# Patient Record
Sex: Male | Born: 1955 | Hispanic: Refuse to answer | Marital: Married | Smoking: Never smoker
Health system: Southern US, Community
[De-identification: ages and names within clinical notes are randomized; demographics above are authoritative.]

## PROBLEM LIST (undated history)

## (undated) DIAGNOSIS — D369 Benign neoplasm, unspecified site: Secondary | ICD-10-CM

## (undated) DIAGNOSIS — K7030 Alcoholic cirrhosis of liver without ascites: Secondary | ICD-10-CM

## (undated) DIAGNOSIS — K649 Unspecified hemorrhoids: Secondary | ICD-10-CM

## (undated) DIAGNOSIS — K2210 Ulcer of esophagus without bleeding: Secondary | ICD-10-CM

## (undated) DIAGNOSIS — K8020 Calculus of gallbladder without cholecystitis without obstruction: Secondary | ICD-10-CM

## (undated) DIAGNOSIS — I81 Portal vein thrombosis: Secondary | ICD-10-CM

## (undated) DIAGNOSIS — D126 Benign neoplasm of colon, unspecified: Secondary | ICD-10-CM

## (undated) DIAGNOSIS — Z9889 Other specified postprocedural states: Secondary | ICD-10-CM

## (undated) DIAGNOSIS — F1010 Alcohol abuse, uncomplicated: Secondary | ICD-10-CM

## (undated) HISTORY — DX: Calculus of gallbladder without cholecystitis without obstruction: K80.20

## (undated) HISTORY — PX: INCISIONAL HERNIA REPAIR: SHX193

## (undated) HISTORY — DX: Ulcer of esophagus without bleeding: K22.10

## (undated) HISTORY — DX: Unspecified hemorrhoids: K64.9

## (undated) HISTORY — DX: Benign neoplasm of colon, unspecified: D12.6

## (undated) HISTORY — DX: Alcoholic cirrhosis of liver without ascites: K70.30

## (undated) HISTORY — PX: OTHER SURGICAL HISTORY: SHX169

## (undated) HISTORY — DX: Portal vein thrombosis: I81

## (undated) HISTORY — DX: Benign neoplasm, unspecified site: D36.9

## (undated) HISTORY — DX: Alcohol abuse, uncomplicated: F10.10

## (undated) HISTORY — DX: Other specified postprocedural states: Z98.890

---

## 2010-08-17 ENCOUNTER — Ambulatory Visit: Payer: Self-pay | Admitting: Urgent Care

## 2010-08-23 DIAGNOSIS — Z9889 Other specified postprocedural states: Secondary | ICD-10-CM

## 2010-08-23 DIAGNOSIS — D126 Benign neoplasm of colon, unspecified: Secondary | ICD-10-CM

## 2010-08-23 HISTORY — DX: Benign neoplasm of colon, unspecified: D12.6

## 2010-08-23 HISTORY — DX: Other specified postprocedural states: Z98.890

## 2010-09-04 ENCOUNTER — Encounter: Payer: Self-pay | Admitting: Gastroenterology

## 2010-09-04 ENCOUNTER — Encounter: Payer: Self-pay | Admitting: Internal Medicine

## 2010-09-04 ENCOUNTER — Ambulatory Visit (INDEPENDENT_AMBULATORY_CARE_PROVIDER_SITE_OTHER): Payer: Self-pay | Admitting: Gastroenterology

## 2010-09-04 DIAGNOSIS — F1010 Alcohol abuse, uncomplicated: Secondary | ICD-10-CM | POA: Insufficient documentation

## 2010-09-04 DIAGNOSIS — K625 Hemorrhage of anus and rectum: Secondary | ICD-10-CM | POA: Insufficient documentation

## 2010-09-04 DIAGNOSIS — R634 Abnormal weight loss: Secondary | ICD-10-CM

## 2010-09-04 DIAGNOSIS — R1013 Epigastric pain: Secondary | ICD-10-CM | POA: Insufficient documentation

## 2010-09-11 NOTE — Letter (Signed)
Summary: TCS/EGD ORDER  TCS/EGD ORDER   Imported By: Ave Filter 09/04/2010 14:30:39  _____________________________________________________________________  External Attachment:    Type:   Image     Comment:   External Document
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
## 2010-09-14 ENCOUNTER — Other Ambulatory Visit: Payer: Self-pay | Admitting: Gastroenterology

## 2010-09-14 LAB — COMPREHENSIVE METABOLIC PANEL
CO2: 24 mEq/L (ref 19–32)
Calcium: 8.2 mg/dL — ABNORMAL LOW (ref 8.4–10.5)
Creat: 0.54 mg/dL (ref 0.40–1.50)
Glucose, Bld: 100 mg/dL — ABNORMAL HIGH (ref 70–99)
Total Bilirubin: 2 mg/dL — ABNORMAL HIGH (ref 0.3–1.2)
Total Protein: 7.7 g/dL (ref 6.0–8.3)

## 2010-09-14 LAB — LIPASE: Lipase: 54 U/L (ref 0–75)

## 2010-09-15 LAB — CBC WITH DIFFERENTIAL/PLATELET
Eosinophils Absolute: 0.4 10*3/uL (ref 0.0–0.7)
Eosinophils Relative: 7 % — ABNORMAL HIGH (ref 0–5)
HCT: 35.9 % — ABNORMAL LOW (ref 39.0–52.0)
Hemoglobin: 11.8 g/dL — ABNORMAL LOW (ref 13.0–17.0)
Lymphs Abs: 1.4 10*3/uL (ref 0.7–4.0)
MCH: 28 pg (ref 26.0–34.0)
MCV: 85.1 fL (ref 78.0–100.0)
Monocytes Relative: 19 % — ABNORMAL HIGH (ref 3–12)
RBC: 4.22 MIL/uL (ref 4.22–5.81)

## 2010-09-16 ENCOUNTER — Other Ambulatory Visit: Payer: Self-pay | Admitting: Gastroenterology

## 2010-09-16 DIAGNOSIS — R945 Abnormal results of liver function studies: Secondary | ICD-10-CM

## 2010-09-16 DIAGNOSIS — F1010 Alcohol abuse, uncomplicated: Secondary | ICD-10-CM

## 2010-09-16 DIAGNOSIS — D649 Anemia, unspecified: Secondary | ICD-10-CM

## 2010-09-17 ENCOUNTER — Encounter: Payer: Self-pay | Admitting: Gastroenterology

## 2010-09-17 ENCOUNTER — Other Ambulatory Visit (HOSPITAL_COMMUNITY): Payer: Self-pay

## 2010-09-17 LAB — IRON AND TIBC
%SAT: 15 % — ABNORMAL LOW (ref 20–55)
TIBC: 317 ug/dL (ref 215–435)

## 2010-09-17 LAB — FOLATE: Folate: 12.2 ng/mL

## 2010-09-17 LAB — FERRITIN: Ferritin: 42 ng/mL (ref 22–322)

## 2010-09-17 NOTE — Progress Notes (Signed)
I spoke with French Ana in Endo and told her to have the pt come by the office in the morning so we can get him scheduled for his ct scan.  We are unable to reach pt by phone.Marland KitchenMarland Kitchen

## 2010-09-18 ENCOUNTER — Encounter (HOSPITAL_COMMUNITY): Payer: Self-pay | Attending: Internal Medicine

## 2010-09-18 DIAGNOSIS — Z139 Encounter for screening, unspecified: Secondary | ICD-10-CM | POA: Insufficient documentation

## 2010-09-20 ENCOUNTER — Other Ambulatory Visit: Payer: Self-pay | Admitting: Internal Medicine

## 2010-09-20 ENCOUNTER — Encounter: Payer: Medicaid Other | Admitting: Internal Medicine

## 2010-09-20 ENCOUNTER — Ambulatory Visit (HOSPITAL_COMMUNITY)
Admission: RE | Admit: 2010-09-20 | Discharge: 2010-09-20 | Disposition: A | Discharge status: Home / Self Care | Payer: Medicaid Other | Source: Ambulatory Visit | Attending: Internal Medicine | Admitting: Internal Medicine

## 2010-09-20 ENCOUNTER — Inpatient Hospital Stay (HOSPITAL_COMMUNITY): Admission: RE | Admit: 2010-09-20 | Payer: Self-pay | Source: Ambulatory Visit

## 2010-09-20 DIAGNOSIS — D126 Benign neoplasm of colon, unspecified: Secondary | ICD-10-CM | POA: Insufficient documentation

## 2010-09-20 DIAGNOSIS — K2100 Gastro-esophageal reflux disease with esophagitis, without bleeding: Secondary | ICD-10-CM | POA: Insufficient documentation

## 2010-09-20 DIAGNOSIS — K921 Melena: Secondary | ICD-10-CM | POA: Insufficient documentation

## 2010-09-20 DIAGNOSIS — K2940 Chronic atrophic gastritis without bleeding: Secondary | ICD-10-CM

## 2010-09-20 DIAGNOSIS — R1013 Epigastric pain: Secondary | ICD-10-CM | POA: Insufficient documentation

## 2010-09-20 DIAGNOSIS — D369 Benign neoplasm, unspecified site: Secondary | ICD-10-CM

## 2010-09-20 DIAGNOSIS — K649 Unspecified hemorrhoids: Secondary | ICD-10-CM

## 2010-09-20 DIAGNOSIS — K648 Other hemorrhoids: Secondary | ICD-10-CM | POA: Insufficient documentation

## 2010-09-20 HISTORY — PX: COLONOSCOPY: SHX174

## 2010-09-20 HISTORY — DX: Benign neoplasm, unspecified site: D36.9

## 2010-09-20 HISTORY — PX: ESOPHAGOGASTRODUODENOSCOPY: SHX1529

## 2010-09-21 NOTE — Progress Notes (Signed)
Please make sure CT of the abdomen and pelvis gets scheduled as previously requested. Recommended LFTs in 2 weeks.

## 2010-09-21 NOTE — Progress Notes (Signed)
Pt is scheduled for ct on 09/25/10.

## 2010-09-25 ENCOUNTER — Inpatient Hospital Stay (HOSPITAL_COMMUNITY): Admission: RE | Admit: 2010-09-25 | Payer: Self-pay | Source: Ambulatory Visit

## 2010-09-25 ENCOUNTER — Other Ambulatory Visit (HOSPITAL_COMMUNITY): Payer: Self-pay

## 2010-09-28 ENCOUNTER — Other Ambulatory Visit (HOSPITAL_COMMUNITY): Payer: Self-pay

## 2010-10-02 NOTE — Op Note (Addendum)
NAME:  Austin Ruiz, Austin Ruiz                  ACCOUNT NO.:  [id number]  MEDICAL RECORD NO.:  [id number]           PATIENT TYPE:  A  LOCATION:  RAD                           FACILITY:  [facility]  PHYSICIAN:  R. Roetta Sessions, M.D. DATE OF BIRTH:  02-Jun-1956  DATE OF PROCEDURE:  09/20/2010 DATE OF DISCHARGE:                              OPERATIVE REPORT   INDICATIONS FOR PROCEDURE:  A 55 year old gentleman with a recent epigastric pain, hematochezia in the setting of a consumption of a good 4-5 40 ounces bottles beer daily.  EGD and colonoscopy now being done. Risks, benefits, limitations, alternatives, imponderables have been discussed, questions answered.  Please see the documentation in the medical record.  PROCEDURE NOTE:  The patient was done under Propofol.  O2 saturation, blood pressure, pulse, respirations were monitored throughout the entire procedure.  Propofol per Dr. Jayme Cloud' associates.  The patient was placed in left lateral decubitus position.  Cetacaine spray for topical pharyngeal anesthesia.  FINDINGS:  EGD examination of the tubular esophagus revealed distal esophageal erosions within 5 mm of the GE junction.  There is no Barrett esophagus.  There are no varices, no tumor.  Patent esophagus throughout its length.  EG junction easily traversed.  Stomach:  Gastric cavity was emptied and insufflated well with air.  Thorough examination of gastric mucosa including retroflexion of proximal stomach and esophagogastric junction demonstrated diffuse submucosal petechiae and a scattered erosions.  There were no ulcer infiltrating process, findings not consistent with portal gastropathy.  Pylorus patent, easily traversed, examination of the bulb and second portion revealed no abnormalities. Therapeutic/diagnostic maneuvers performed.  Biopsies of the antrum and body were taken to rule out H. Pylori.  The patient tolerated the procedure well and was prepared for colonoscopy.  Digital  rectal exam revealed no abnormalities.  Endoscopic Findings:  The prep was adequate. Colon:  Colonic mucosa surveyed from the rectosigmoid junction through the left transverse right colon to the appendiceal orifice, ileocecal valve/cecum.  These structures well seen photographed for the record. From this level, scope was slowly and cautiously withdrawn.  All previously mentioned mucosal surfaces were again seen.  The patient was noted have a 5-mm polyp in the ascending colon which was cold biopsy/removed.  Remainder of the colonic mucosa appeared normal.  The scope was pulled down in the rectum where thorough examination of the rectal mucosa including retroflexed view of the anal verge, en face view of the anal canal demonstrated friable anal canal with some anal canal hemorrhoids, otherwise rectal mucosa appeared normal.  The patient tolerated this procedure well.  Cecal withdrawal time 10 minutes.  IMPRESSION: 1. Esophagogastroduodenoscopy, distal esophageal erosions consistent     with erosive reflux esophagitis. 2. Diffuse submucosal gastric petechiae and the gastric erosions status post biopsy, patent pylorus, normal D1, D2.  Colonoscopy     findings, friable anal canal/anal canal hemorrhoids, likely source     of hematochezia, otherwise normal rectum. 3. Ascending colon polyp status post cold biopsy removal.  RECOMMENDATIONS: 1. Stop consuming alcohol. 2. Antireflux literature and hemorrhoid literature provided to Mr.     Raffel.  Begin  Protonix 40 mg orally daily. 3. A 10-day course of Anusol-HC Suppository 1 per rectum at bedtime. 4. Followup appointment with [place] in 6 weeks. 5. Followup on path.     [name], M.D.     RMR/MEDQ  D:  09/20/2010  T:  09/21/2010  Job:  [id number]  Electronically Signed by Lorrin Goodell M.D. on 10/02/2010 02:52:03 PM

## 2010-10-03 ENCOUNTER — Other Ambulatory Visit (HOSPITAL_COMMUNITY): Payer: Self-pay

## 2010-10-09 ENCOUNTER — Ambulatory Visit (HOSPITAL_COMMUNITY)
Admission: RE | Admit: 2010-10-09 | Discharge: 2010-10-09 | Disposition: A | Discharge status: Home / Self Care | Payer: Medicaid Other | Source: Ambulatory Visit | Attending: Gastroenterology | Admitting: Gastroenterology

## 2010-10-09 ENCOUNTER — Other Ambulatory Visit: Payer: Self-pay | Admitting: Gastroenterology

## 2010-10-09 DIAGNOSIS — R188 Other ascites: Secondary | ICD-10-CM | POA: Insufficient documentation

## 2010-10-09 DIAGNOSIS — R748 Abnormal levels of other serum enzymes: Secondary | ICD-10-CM | POA: Insufficient documentation

## 2010-10-09 DIAGNOSIS — K8020 Calculus of gallbladder without cholecystitis without obstruction: Secondary | ICD-10-CM | POA: Insufficient documentation

## 2010-10-09 DIAGNOSIS — F1021 Alcohol dependence, in remission: Secondary | ICD-10-CM | POA: Insufficient documentation

## 2010-10-09 MED ORDER — IOHEXOL 300 MG/ML  SOLN
100.0000 mL | Freq: Once | INTRAMUSCULAR | Status: AC | PRN
  Administered 2010-10-09: 100 mL via INTRAVENOUS

## 2010-10-22 ENCOUNTER — Other Ambulatory Visit: Payer: Self-pay | Admitting: Gastroenterology

## 2010-10-22 DIAGNOSIS — R945 Abnormal results of liver function studies: Secondary | ICD-10-CM

## 2010-10-22 DIAGNOSIS — K7460 Unspecified cirrhosis of liver: Secondary | ICD-10-CM

## 2010-10-22 DIAGNOSIS — D696 Thrombocytopenia, unspecified: Secondary | ICD-10-CM

## 2010-10-23 ENCOUNTER — Other Ambulatory Visit: Payer: Self-pay | Admitting: Gastroenterology

## 2010-10-24 LAB — HEPATIC FUNCTION PANEL
ALT: 23 U/L (ref 0–53)
AST: 106 U/L — ABNORMAL HIGH (ref 0–37)
Bilirubin, Direct: 0.8 mg/dL — ABNORMAL HIGH (ref 0.0–0.3)
Indirect Bilirubin: 1.4 mg/dL — ABNORMAL HIGH (ref 0.0–0.9)
Total Protein: 7.3 g/dL (ref 6.0–8.3)

## 2010-10-24 LAB — CBC WITH DIFFERENTIAL/PLATELET
Basophils Relative: 3 % — ABNORMAL HIGH (ref 0–1)
HCT: 34.9 % — ABNORMAL LOW (ref 39.0–52.0)
Hemoglobin: 11.2 g/dL — ABNORMAL LOW (ref 13.0–17.0)
Lymphocytes Relative: 24 % (ref 12–46)
MCHC: 32.1 g/dL (ref 30.0–36.0)
Monocytes Absolute: 0.9 10*3/uL (ref 0.1–1.0)
Monocytes Relative: 15 % — ABNORMAL HIGH (ref 3–12)
Neutro Abs: 3.2 10*3/uL (ref 1.7–7.7)

## 2010-10-24 LAB — BASIC METABOLIC PANEL
CO2: 25 mEq/L (ref 19–32)
Glucose, Bld: 100 mg/dL — ABNORMAL HIGH (ref 70–99)
Potassium: 4.2 mEq/L (ref 3.5–5.3)
Sodium: 137 mEq/L (ref 135–145)

## 2010-10-24 LAB — AFP TUMOR MARKER: AFP-Tumor Marker: 8.1 ng/mL — ABNORMAL HIGH (ref 0.0–8.0)

## 2010-10-29 ENCOUNTER — Other Ambulatory Visit: Payer: Self-pay | Admitting: Gastroenterology

## 2010-10-29 DIAGNOSIS — K7460 Unspecified cirrhosis of liver: Secondary | ICD-10-CM

## 2010-11-01 ENCOUNTER — Encounter: Payer: Self-pay | Admitting: Gastroenterology

## 2010-11-01 ENCOUNTER — Ambulatory Visit (INDEPENDENT_AMBULATORY_CARE_PROVIDER_SITE_OTHER): Payer: Medicaid Other | Admitting: Gastroenterology

## 2010-11-01 ENCOUNTER — Ambulatory Visit: Payer: Self-pay | Admitting: Gastroenterology

## 2010-11-01 VITALS — BP 139/79 | HR 64 | Temp 98.6°F | Ht 63.0 in | Wt 151.0 lb

## 2010-11-01 DIAGNOSIS — K7460 Unspecified cirrhosis of liver: Secondary | ICD-10-CM

## 2010-11-01 MED ORDER — CHLORDIAZEPOXIDE HCL 25 MG PO CAPS: 25.0000 mg | ORAL_CAPSULE | Freq: Three times a day (TID) | ORAL | Status: DC | PRN

## 2010-11-01 MED ORDER — DEXLANSOPRAZOLE 60 MG PO CPDR: 60.0000 mg | DELAYED_RELEASE_CAPSULE | Freq: Every day | ORAL | Status: DC
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
## 2010-11-01 NOTE — Patient Instructions (Addendum)
Complete stool sample and return to our office.  If you are serious about quitting alcohol, we have provided you a medication (Librium) to help with possible side effects of withdrawal such as anxiety or tremors. DO NOT TAKE THIS WITH ALCOHOL. If you decide to keep drinking, DO NOT TAKE THIS MEDICATION.  Continue Dexilant. Activate the savings card before filling the prescription.  We will see you back in 1 week to discuss how you are doing.

## 2010-11-02 LAB — PROTIME-INR: INR: 1.6 — ABNORMAL HIGH (ref <1.50)

## 2010-11-06 ENCOUNTER — Ambulatory Visit: Payer: Self-pay | Admitting: Gastroenterology

## 2010-11-06 ENCOUNTER — Encounter: Payer: Self-pay | Admitting: Gastroenterology

## 2010-11-06 DIAGNOSIS — K7460 Unspecified cirrhosis of liver: Secondary | ICD-10-CM | POA: Insufficient documentation

## 2010-11-06 DIAGNOSIS — D649 Anemia, unspecified: Secondary | ICD-10-CM

## 2010-11-06 NOTE — Progress Notes (Signed)
Pt returned ifobt and it was positive 

## 2010-11-06 NOTE — Progress Notes (Signed)
Cc to PCP 

## 2010-11-06 NOTE — Assessment & Plan Note (Signed)
Newly diagnosed, likely r/t ETOH abuse. recent AFP and CT performed for HCC. AFP mildly elevated at 8.1: to be rechecked in 2 months. No ascites on exam. Pt desires ETOH cessation. Discussed in detail the diagnosis of cirrhosis, management, plan, and future possibilities for patient. Counseled at length on ETOH cessation. Will help facilitate this with low-dose Librium, as pt has expressed a significant desire to stop. Will follow pt closely; return to clinic in 1 week. Counseled on avoidance of ETOH while taking Librium.   As of note, still need to collect ifobt due to anemia. EGD/colonoscopy up-to-date.  Finally, pt with RUQ tenderness. Assess at visit in 1 week. Known stones on CT. Consider HIDA if symptoms increase/worsen, associated with eating/drinking.  Continue dexilant PT/INR today

## 2010-11-08 ENCOUNTER — Encounter: Payer: Self-pay | Admitting: Gastroenterology

## 2010-11-08 ENCOUNTER — Ambulatory Visit (INDEPENDENT_AMBULATORY_CARE_PROVIDER_SITE_OTHER): Payer: Self-pay | Admitting: Gastroenterology

## 2010-11-08 DIAGNOSIS — R1013 Epigastric pain: Secondary | ICD-10-CM

## 2010-11-08 DIAGNOSIS — K7460 Unspecified cirrhosis of liver: Secondary | ICD-10-CM

## 2010-11-08 MED ORDER — DEXLANSOPRAZOLE 60 MG PO CPDR: 60.0000 mg | DELAYED_RELEASE_CAPSULE | Freq: Every day | ORAL | Status: AC

## 2010-11-08 NOTE — Assessment & Plan Note (Signed)
See under cirrhosis.
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
## 2010-11-08 NOTE — Assessment & Plan Note (Signed)
Newly diagnosed cirrhosis, likely ETOH related. Has not quit alcohol consumption, although he expressed a desire to do so last week. Definitely clouding the clinical picture in regards to abdominal pain, RUQ/epigastric pain. Biliary component remains in the differential. Discussed with pt at length possible work-up; however, also discussed the need for alcohol cessation before proceeding with unnecessary tests.   1. Repeat CBC in 4 weeks. Assess for stability. No need for capsule study currently. 2. Alcohol cessation. Once pt has achieved this, possibly pursue HIDA scan. 3. Continue PPI 4. F/U in 4 weeks. I discussed with pt in detail that we would be unable to fully assist him unless he quit drinking. He was very agreeable and pleasant, and he says he understands.

## 2010-11-08 NOTE — Progress Notes (Signed)
For Austin Ruiz.  

## 2010-11-09 NOTE — Progress Notes (Signed)
Cc to PCP 

## 2010-11-13 NOTE — Progress Notes (Signed)
Reviewed. Please see office note.

## 2010-11-23 NOTE — Progress Notes (Signed)
agree

## 2010-12-06 ENCOUNTER — Ambulatory Visit: Payer: Self-pay | Admitting: Gastroenterology

## 2010-12-11 ENCOUNTER — Encounter: Payer: Self-pay | Admitting: Urgent Care

## 2010-12-11 ENCOUNTER — Ambulatory Visit (INDEPENDENT_AMBULATORY_CARE_PROVIDER_SITE_OTHER): Payer: Medicaid Other | Admitting: Urgent Care

## 2010-12-11 VITALS — BP 132/65 | HR 74 | Temp 98.7°F | Ht 62.0 in | Wt 152.6 lb

## 2010-12-11 DIAGNOSIS — R1013 Epigastric pain: Secondary | ICD-10-CM

## 2010-12-11 DIAGNOSIS — M79661 Pain in right lower leg: Secondary | ICD-10-CM

## 2010-12-11 DIAGNOSIS — R609 Edema, unspecified: Secondary | ICD-10-CM

## 2010-12-11 DIAGNOSIS — R600 Localized edema: Secondary | ICD-10-CM

## 2010-12-11 DIAGNOSIS — M79609 Pain in unspecified limb: Secondary | ICD-10-CM

## 2010-12-11 DIAGNOSIS — K7460 Unspecified cirrhosis of liver: Secondary | ICD-10-CM

## 2010-12-11 DIAGNOSIS — F1010 Alcohol abuse, uncomplicated: Secondary | ICD-10-CM

## 2010-12-11 LAB — COMPREHENSIVE METABOLIC PANEL
ALT: 32 U/L (ref 0–53)
BUN: 6 mg/dL (ref 6–23)
CO2: 26 mEq/L (ref 19–32)
Calcium: 8.2 mg/dL — ABNORMAL LOW (ref 8.4–10.5)
Chloride: 104 mEq/L (ref 96–112)
Creat: 0.47 mg/dL — ABNORMAL LOW (ref 0.50–1.35)
Glucose, Bld: 98 mg/dL (ref 70–99)
Total Bilirubin: 1.8 mg/dL — ABNORMAL HIGH (ref 0.3–1.2)

## 2010-12-11 LAB — CBC WITH DIFFERENTIAL/PLATELET
Basophils Absolute: 0.2 10*3/uL — ABNORMAL HIGH (ref 0.0–0.1)
Eosinophils Relative: 7 % — ABNORMAL HIGH (ref 0–5)
HCT: 35 % — ABNORMAL LOW (ref 39.0–52.0)
Hemoglobin: 11.4 g/dL — ABNORMAL LOW (ref 13.0–17.0)
Lymphocytes Relative: 28 % (ref 12–46)
Lymphs Abs: 2.3 10*3/uL (ref 0.7–4.0)
MCV: 91.4 fL (ref 78.0–100.0)
Monocytes Absolute: 1.2 10*3/uL — ABNORMAL HIGH (ref 0.1–1.0)
Neutro Abs: 3.8 10*3/uL (ref 1.7–7.7)
RBC: 3.83 MIL/uL — ABNORMAL LOW (ref 4.22–5.81)
RDW: 19.7 % — ABNORMAL HIGH (ref 11.5–15.5)
WBC: 8 10*3/uL (ref 4.0–10.5)

## 2010-12-11 NOTE — Progress Notes (Signed)
Cc to PCP 

## 2010-12-11 NOTE — Assessment & Plan Note (Addendum)
Mr. Rosier seems to be in denial as far as his etoh abuse and cirrhosis is concerned.  New c/o calf pain bilaterally & lower extremity edema.  No ascites.  Mildly elevated AFP will need FU next month.  No mass on recent CT.  Recent EGD without varices, but erosive esophagitis/gastritis.  Life threatening complications of cirrhosis and pt non-compliance discussed w/ pt & wife.    Bilat doppler ultrasound LE to r/o DVT STAT CBC & CMP I will call you with results Call Dr. Neita Carp as soon as possible to discuss your options to quit drinking-pt prefers inpatient treatment QUIT DRINKING ALCOHOL!   DO NOT BUY ALCOHOL OR KEEP IT AT YOUR HOUSE!

## 2010-12-11 NOTE — Assessment & Plan Note (Signed)
New.  R/o DVT vs. Anasarca from chronic liver disease & decompensation.
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
## 2010-12-11 NOTE — Patient Instructions (Signed)
GO get ultrasound of your legs & labs I will call you with results Please call Dr. Neita Carp as soon as possible to discuss your options to quit drinking QUIT DRINKING ALCOHOL!   DO NOT BUY ALCOHOL OR KEEP IT AT YOUR HOUSE!  Consumo de alcohol y drogas Cundo es uso excesivo? Cundo se considera adiccin? (Alcohol and Drug Use, When Is It Abuse? When Is It Addiction?) Cuando el consumo de alcohol o drogas interfiere en las [address] normales de la vida, se denomina uso excesivo. Pueden surgir problemas familiares, con amigos, laborales, de salud, legales, econmicos y Investment banker, corporate. Los cambios en el estado de nimo no son infrecuentes. Puede aparecer y persistir un trastorno depresivo. Cuando el uso excesivo persiste a Scientist, water quality de las consecuencias adversas puede progresar a Medical laboratory scientific officer. La adiccin al alcohol se conoce habitualmente como alcoholismo. La adiccin a otras sustancias se conoce como drogadiccin. Se llama dependencia qumica al [employer] en el que una persona no puede controlar el consumo de alcohol o drogas. CAUSAS  Las causas del alcoholismo y la adiccin a las drogas (dependencia qumica) no se conocen.   Algunos estudios han revelado que el hecho de tener un familiar alcohlico predispone al alcoholismo.   El riesgo de [place] persona de Environmental education officer adiccin al alcohol o a las drogas puede aumentar si recibe influencia de su ambiente. Aqu se incluye:   El lugar y Retail banker en que vive.  Su familia, amigos y [place].   La presin de sus pares.  La facilidad para conseguir drogas o alcohol   SNTOMAS  Una intensa necesidad o impulsividad de beber o consumir drogas.   No puede limitar la ingesta de alcohol o drogas en ciertas ocasiones (prdida del control).   Los sntomas de abstinencia como nuseas, sudoracin, temblores y ansiedad se producen cuando el alcohol o la droga se dejan de consumir luego de un perodo de consumo intenso (dependencia fsica)-   La necesidad de beber  cantidades cada vez mayores de alcohol o de consumir cada vez ms drogas se conoce como "tolerancia".  DIAGNSTICO El diagnstico debe realizarlo un mdico especializado o un terapeuta que trate adicciones. Sin embargo es posible darse cuenta si tiene un problema, respondiendo a las preguntas que siguen a continuacin.  Algunos amigos o familiares le han dicho que su adiccin se ha convertido en un problema?   Al consumir tiene ms tendencia a participar en rias?   No recuerda lo que hace cuando consume?Lovenia Kim acerca de las cantidades que consume?   Necesita sustancias qumicas para ponerse en marcha?Foye Deer dificultades escolares o laborales debido al consumo?   Se siente mal al consumir pero lo sigue haciendo?   Necesita drogas o alcohol para relacionarse con la gente o para sentirse cmodo en situaciones sociales?   Consume para olvidar problemas?  Si respondi SI a cualquiera de estas preguntas, usted presenta signos de dependencia qumica. Le sugerimos que realice una evaluacin con un profesional. Cuanto mayor sea el tiempo de consumo, mayores sern [place]. TRATAMIENTO El alcoholismo no tiene nada que ver con la voluntad. Los [employer] tienen una necesidad incontrolable de consumir que supera su capacidad de International aid/development worker. Esta necesidad puede ser tan intensa como el deseo de beber o comer. Aunque algunas personas pueden recuperarse sin ayuda, la [place] de los adictos la necesitan. Con tratamiento y [employer] pueden recuperarse. Es posible lograr una recuperacin duradera.  La adiccin no puede curarse pero puede detenerse. La [employer]  hospitales y clnicas pueden remitirlo a un centro de cuidado especializado.  Document Released: 06/10/2005 Document Re-Released: 05/23/2008 [facility] Patient Information 2011 [place], [place].Alcohol and Drug Use  When Is It Abuse? When Is It Addiction? When alcohol and/or drug use interferes with normal living  activities, it is called alcohol and/or drug abuse. Problems can be with family and friends, one's job, health issues, legal issues, financial and emotional problems. Mood swings are not uncommon. Depression may even develop and persist. When abuse continues even with bad consequences, the substance abuse may have progressed to addiction. Addiction to alcohol is usually referred to as alcoholism. Addiction to other substances is often referred to as drug addiction. Chemical dependency also describes the condition where a person cannot control their alcohol and/or drug use. CAUSES   The cause of alcoholism and drug addiction (chemical dependency) is unknown.   Research has found that having an alcoholic family member makes it more likely to have another family member become an alcoholic as well.   A person's risk for developing alcoholism or drug addiction can increase based on the person's environment. This includes:   Where and how he or she lives.  Family, friends, and culture.   Peer pressure.  How easy it is to get alcohol and drugs.   SYMPTOMS  A strong need or instinct to drink and/or use drugs (cravings).   Not able to limit drinking and/or drug-taking on any given occasion (loss of control).   Withdrawal symptoms such as nausea, sweating, shakiness and anxiety occur when alcohol and/or drug use is stopped after a period of heavy drinking and/or using (physical dependence).   The need to drink greater amounts of alcohol or take greater amounts of drugs in order to "get high" (tolerance).  DIAGNOSIS The diagnosis is best made by a physician who specializes in addiction medicine or a licensed drug and alcohol specialist. However, it is possible to get an idea if you have a problem by answering the questions below.  Have you been told by friends or family that drugs/alcohol has become a problem?   Do you get into fights when drinking or using drugs?   Do you not remember what you  do while using (blackouts)?   Do you lie about use or amounts of drugs or alcohol you consume?   Do you need chemicals to get you going?   Do you suffer in work performance or school because of drug or alcohol use?   Do you get sick from use of drugs or alcohol, but keep using anyway?   Do you need drugs or alcohol to relate to people or feel comfortable in social situations?   Do you use drugs or alcohol to forget problems?  If you answered yes to any of the above questions, you show signs of chemical dependency. Professional evaluation is suggested. The longer you use drugs and alcohol, the greater the problems will become. TREATMENT Alcoholism/addiction has little to do with willpower. Alcoholics and addicts have an uncontrollable need for alcohol and/or drugs that overrides their ability to stop drinking or using. This need can be as strong as the need for food or water. Although some people are able to recover from alcoholism/addiction without help, most alcoholics and addicts need help. With treatment and support, many people are able to stop drinking and using drugs. Long term recovery is possible.  Addiction cannot be cured but it can be treated successfully. Most hospitals and clinics can refer you to a specialized  care center.  Document Released: 06/04/2001 Document Re-Released: 03/19/2008 [facility] Patient Information 2011 [place], [place].

## 2010-12-11 NOTE — Assessment & Plan Note (Signed)
Denial.  Urged cessation.

## 2010-12-11 NOTE — Assessment & Plan Note (Signed)
Possibly secondary to gastritis, etoh hepatitis vs. Symptomatic gallbladder disease (known cholelithiasis).

## 2010-12-12 ENCOUNTER — Ambulatory Visit (HOSPITAL_COMMUNITY): Admission: RE | Admit: 2010-12-12 | Payer: Self-pay | Source: Ambulatory Visit

## 2010-12-12 ENCOUNTER — Other Ambulatory Visit: Payer: Self-pay | Admitting: Urgent Care

## 2010-12-12 ENCOUNTER — Other Ambulatory Visit: Payer: Self-pay

## 2010-12-12 DIAGNOSIS — K7460 Unspecified cirrhosis of liver: Secondary | ICD-10-CM

## 2010-12-12 NOTE — Progress Notes (Signed)
Did pt go get LE doppler ultrasounds to r/o DVT yesterday?   No report in epic or echart.

## 2010-12-12 NOTE — Progress Notes (Signed)
Called pt- he said he had to pick up his son yesterday and he will go today to have dopplers and additional bloodwork done.

## 2010-12-13 ENCOUNTER — Other Ambulatory Visit (HOSPITAL_COMMUNITY): Payer: Self-pay

## 2010-12-13 LAB — PROTIME-INR
INR: 1.52 — ABNORMAL HIGH (ref <1.50)
Prothrombin Time: 18.9 seconds — ABNORMAL HIGH (ref 11.6–15.2)

## 2010-12-14 ENCOUNTER — Ambulatory Visit (HOSPITAL_COMMUNITY)
Admission: RE | Admit: 2010-12-14 | Discharge: 2010-12-14 | Disposition: A | Discharge status: Home / Self Care | Payer: Self-pay | Source: Ambulatory Visit | Attending: Urgent Care | Admitting: Urgent Care

## 2010-12-14 DIAGNOSIS — R600 Localized edema: Secondary | ICD-10-CM

## 2010-12-14 DIAGNOSIS — M7989 Other specified soft tissue disorders: Secondary | ICD-10-CM | POA: Insufficient documentation

## 2010-12-14 DIAGNOSIS — M79609 Pain in unspecified limb: Secondary | ICD-10-CM | POA: Insufficient documentation

## 2010-12-14 DIAGNOSIS — M79662 Pain in left lower leg: Secondary | ICD-10-CM

## 2011-01-03 ENCOUNTER — Other Ambulatory Visit: Payer: Self-pay | Admitting: Gastroenterology

## 2011-01-04 LAB — AFP TUMOR MARKER: AFP-Tumor Marker: 7.2 ng/mL (ref 0.0–8.0)

## 2011-01-07 ENCOUNTER — Other Ambulatory Visit: Payer: Self-pay

## 2011-01-07 DIAGNOSIS — K7460 Unspecified cirrhosis of liver: Secondary | ICD-10-CM

## 2011-03-05 ENCOUNTER — Encounter: Payer: Self-pay | Admitting: Gastroenterology

## 2011-03-05 ENCOUNTER — Ambulatory Visit (INDEPENDENT_AMBULATORY_CARE_PROVIDER_SITE_OTHER): Payer: Medicaid Other | Admitting: Gastroenterology

## 2011-03-05 VITALS — BP 146/84 | HR 78 | Temp 97.4°F | Ht 62.0 in | Wt 149.6 lb

## 2011-03-05 DIAGNOSIS — D649 Anemia, unspecified: Secondary | ICD-10-CM | POA: Insufficient documentation

## 2011-03-05 DIAGNOSIS — R799 Abnormal finding of blood chemistry, unspecified: Secondary | ICD-10-CM

## 2011-03-05 DIAGNOSIS — K7030 Alcoholic cirrhosis of liver without ascites: Secondary | ICD-10-CM | POA: Insufficient documentation

## 2011-03-05 DIAGNOSIS — K8020 Calculus of gallbladder without cholecystitis without obstruction: Secondary | ICD-10-CM | POA: Insufficient documentation

## 2011-03-05 DIAGNOSIS — R772 Abnormality of alphafetoprotein: Secondary | ICD-10-CM

## 2011-03-05 DIAGNOSIS — R1011 Right upper quadrant pain: Secondary | ICD-10-CM

## 2011-03-05 DIAGNOSIS — K7460 Unspecified cirrhosis of liver: Secondary | ICD-10-CM

## 2011-03-05 DIAGNOSIS — F1010 Alcohol abuse, uncomplicated: Secondary | ICD-10-CM

## 2011-03-05 LAB — PROTIME-INR
INR: 1.82 — ABNORMAL HIGH (ref <1.50)
Prothrombin Time: 21.7 seconds — ABNORMAL HIGH (ref 11.6–15.2)

## 2011-03-05 LAB — AMMONIA: Ammonia: 39 umol/L (ref 11–60)

## 2011-03-05 MED ORDER — LACTULOSE 10 GM/15ML PO SOLN: 20.0000 g | Freq: Two times a day (BID) | ORAL | Status: AC

## 2011-03-05 NOTE — Assessment & Plan Note (Signed)
H/O elevated AFP. Recheck. Abd u/s planned as well.

## 2011-03-05 NOTE — Assessment & Plan Note (Addendum)
Has been stable. No overt GI bleeding. Likely secondary to anemia chronic disease. Previous ferritin, b12, folate ok.

## 2011-03-05 NOTE — Assessment & Plan Note (Signed)
Worsening ruq pain. Abdominal u/s to r/o acute cholecystitis. Has h/o cholelithiasis. Check labs.

## 2011-03-05 NOTE — Progress Notes (Signed)
Cc to PCP 

## 2011-03-05 NOTE — Progress Notes (Signed)
  Primary Care Physician: Estanislado Pandy, MD  Primary Gastroenterologist:  Roetta Sessions, MD   Chief Complaint  Patient presents with  . Follow-up    HPI: Austin Ruiz is a 55 y.o. male here for f/u. He is still drinking etoh. Never saw PCP to discuss inpatient rehab. Wife with him today. Neither have much to say. Patient still drinking at least 40 ounces of beer daily. C/O confusion and drowsiness. Not worked in [employer] now. Depression. No n/v. Occasional heartburn. BM 2-3 per week. No melena, brbpr. Rare melena. No stool softners or laxative. C/O worsening ruq pain. Most days. Worse with meals. Better with cup of orange juice. Beer does not worsen. No vomiting.    Current Outpatient Prescriptions  Medication Sig Dispense Refill  . Multiple Vitamin (MULTIVITAMIN) capsule Take 1 capsule by mouth daily.        . vitamin B-12 (CYANOCOBALAMIN) 1000 MCG tablet Take 1,000 mcg by mouth daily.        Marland Kitchen lactulose (CHRONULAC) 10 GM/15ML solution Take 30 mLs (20 g total) by mouth 2 (two) times daily.  1892 mL  0    Allergies as of 03/05/2011  . (No Known Allergies)    ROS:  General: Negative for anorexia, weight loss, fever, chills, fatigue, weakness. C/O confusion, difficulty thinking, feels "stupid". ENT: Negative for hoarseness, difficulty swallowing , nasal congestion. CV: Negative for chest pain, angina, palpitations, dyspnea on exertion, peripheral edema.  Respiratory: Negative for dyspnea at rest, dyspnea on exertion, cough, sputum, wheezing.  GI: See history of present illness. GU:  Negative for dysuria, hematuria, urinary incontinence, urinary frequency, nocturnal urination.  Endo: Negative for unusual weight change.    Physical Examination:   BP 146/84  Pulse 78  Temp(Src) 97.4 F (36.3 C) (Temporal)  Ht 5\' 2"  (1.575 m)  Wt 149 lb 9.6 oz (67.858 kg)  BMI 27.36 kg/m2  General: Well-nourished, well-developed in no acute distress.  Eyes: No icterus. Mouth: Oropharyngeal  mucosa moist and pink , no lesions erythema or exudate. Lungs: Clear to auscultation bilaterally.  Heart: Regular rate and rhythm, no murmurs rubs or gallops.  Abdomen: Bowel sounds are normal, moderate ruq tenderness, nondistended, no hepatosplenomegaly or masses, no abdominal bruits or hernia , no rebound or guarding.   Extremities: Trace pedal edema. No clubbing or deformities. Neuro: Alert and oriented x 4   Skin: Warm and dry, no jaundice.   Psych: Alert and cooperative, normal mood and affect.  Labs:  Lab Results  Component Value Date   ALT 32 12/11/2010   AST 144* 12/11/2010   ALKPHOS 207* 12/11/2010   BILITOT 1.8* 12/11/2010   Lab Results  Component Value Date   WBC 8.0 12/11/2010   HGB 11.4* 12/11/2010   HCT 35.0* 12/11/2010   MCV 91.4 12/11/2010   PLT 145* 12/11/2010       Lab Results  Component Value Date   INR 1.52* 12/12/2010   INR 1.60* 11/01/2010

## 2011-03-05 NOTE — Patient Instructions (Signed)
You have to stop drinking alcohol!!! Let us know when you are ready. Have lab work done TODAY! Have ultrasound done. If lab work okay, we may start you on fluid pills. Start lactulose for confusion and constipation. Take two tablespoons twice daily. You need to have two bowel movements daily. Call if medication not working and we will adjust the dose.  Cirrhosis Scarring of the Liver Cirrhosis is a condition of scarring of the liver which is caused when the liver has tried repairing itself following damage. This damage may come from a previous infection such as one of the forms of hepatitis (usually Hepatitis C), or the damage may come from being injured by toxins. The main toxin to do this is alcohol. The scarring of the liver from use of alcohol is irreversible. That means the liver cannot return to normal even though alcohol is not used any more. The main danger of Hepatitis C infection is that it may cause chronic (longstanding) liver disease, and this also may lead to cirrhosis (scarring of the liver). This complication is progressive and irreversible. CAUSES Prior to available blood tests, Hepatitis C could be contracted by blood transfusions. Since testing of blood has improved, this is now unlikely. This infection can also be contracted through intravenous drug use and the sharing of needles. It can also be contracted through physical (sexual) relationships. The injury caused by alcohol comes from too much use. It is not a few drinks that poison the liver, but years of misuse. Usually there will be some signs and symptoms (problems) early with scarring of the liver that suggest the development of better habits. Alcohol should never be used while using Tylenol (acetaminophen). A small dose of both taken together may cause irreversible damage to the liver. HOME CARE INSTRUCTIONS There is no specific treatment for cirrhosis; however there are things you can do to avoid making the condition  worse.  Rest as needed.   Eat a well balanced diet. Your caregiver can help you with suggestions on this.   Vitamin supplements including vitamins A, K, D, and thiamine can help.   A low salt diet, water restriction, or diuretic medicine may be needed to reduce fluid retention.   Avoid alcohol. This can be an extremely toxic if combined with acetaminophen (Tylenol).   Avoid drugs which are toxic to the liver. Some of these are: Isoniazid, Methyldopa (Aldomet) acetaminophen (Tylenol), anabolic steroids (muscle building drugs), erythromycin, and oral contraceptives (birth control pills). Check with your caregiver to make sure medications you are presently taking will not be harmful.   Periodic blood tests may be required. Follow your caregiver's advice regarding the timing of these.   Milk thistle is an herbal remedy which does protect the liver against toxins; however it will not help once the liver has been scarred.  SEEK MEDICAL CARE IF:  You have increasing fatigue or weakness.   You develop swelling of the hands, feet, legs or face.   You vomit bright red blood, or a coffee ground appearing material.   You have blood in your stools, or the stools turn black and tarry.   An oral temperature above 101 develops.   You develop loss of appetite, or have nausea (feeling sick to your stomach) and vomiting.   You develop jaundice.   You develop easy bruising or bleeding.   You have worsening of any of the problems you are concerned about.  Document Released: 06/10/2005 Document Re-Released: 09/06/2008 [facility] Patient Information 2011 [place], [place].

## 2011-03-05 NOTE — Assessment & Plan Note (Addendum)
Continued ongoing etoh abuse. Patient given trial of librium but did not stop drinking. Does not express interest in quiting at this time. Urged abstinence. Discussed that he would not be eligible for liver transplant in the future if he continues to drink.   Concerned for possible low-grade hepatic encephalopathy. Will start lactulose. Check ammonia level. Check AFP, CMET, CBC, lipase. Abd u/s to be done for surveillance but also to r/o cholecystitis. Having some lower ext edema. Consider starting diuretics after blood work.

## 2011-03-06 LAB — CBC WITH DIFFERENTIAL/PLATELET
Basophils Absolute: 0.2 10*3/uL — ABNORMAL HIGH (ref 0.0–0.1)
Basophils Relative: 2 % — ABNORMAL HIGH (ref 0–1)
HCT: 37.2 % — ABNORMAL LOW (ref 39.0–52.0)
MCHC: 33.9 g/dL (ref 30.0–36.0)
Monocytes Absolute: 1.2 10*3/uL — ABNORMAL HIGH (ref 0.1–1.0)
Neutro Abs: 5.5 10*3/uL (ref 1.7–7.7)
Neutrophils Relative %: 65 % (ref 43–77)
Platelets: 118 10*3/uL — ABNORMAL LOW (ref 150–400)
RDW: 19.3 % — ABNORMAL HIGH (ref 11.5–15.5)
WBC: 8.4 10*3/uL (ref 4.0–10.5)

## 2011-03-06 LAB — COMPREHENSIVE METABOLIC PANEL
AST: 105 U/L — ABNORMAL HIGH (ref 0–37)
Albumin: 2.9 g/dL — ABNORMAL LOW (ref 3.5–5.2)
Alkaline Phosphatase: 207 U/L — ABNORMAL HIGH (ref 39–117)
BUN: 5 mg/dL — ABNORMAL LOW (ref 6–23)
Creat: 0.44 mg/dL — ABNORMAL LOW (ref 0.50–1.35)
Glucose, Bld: 90 mg/dL (ref 70–99)
Total Bilirubin: 2.9 mg/dL — ABNORMAL HIGH (ref 0.3–1.2)

## 2011-03-06 LAB — AFP TUMOR MARKER: AFP-Tumor Marker: 8.3 ng/mL — ABNORMAL HIGH (ref 0.0–8.0)

## 2011-03-12 NOTE — Progress Notes (Signed)
Quick Note:  1. Anemia/thrombocytopenia stable. 2. BUN/Creatinine good. 3. LFTs up, c/w etoh hepatitis. DF=47. Recommend prednisone. 4. AFP up, stable. 5. INR up secondary to cirrhosis and ongoing etoh hepatitis. 6. Ammonia level normal, good.  Await abd u/s Start aldactone 50mg  po daily #30, 1rf. For edema. Prednisone 40mg  po daily for four weeks, 30mg  po daily for 5 days, 20mg  po daily for 5 days, 10mg  po daily for 5 days. Quantity sufficient with no refills. CMET, PT/INR in 10 days. Stop drinking. OV in 2 weeks.  ______

## 2011-03-13 ENCOUNTER — Ambulatory Visit (HOSPITAL_COMMUNITY)
Admission: RE | Admit: 2011-03-13 | Discharge: 2011-03-13 | Disposition: A | Discharge status: Home / Self Care | Payer: Medicaid Other | Source: Ambulatory Visit | Attending: Gastroenterology | Admitting: Gastroenterology

## 2011-03-13 DIAGNOSIS — R1011 Right upper quadrant pain: Secondary | ICD-10-CM

## 2011-03-13 DIAGNOSIS — D649 Anemia, unspecified: Secondary | ICD-10-CM

## 2011-03-13 DIAGNOSIS — K8020 Calculus of gallbladder without cholecystitis without obstruction: Secondary | ICD-10-CM | POA: Insufficient documentation

## 2011-03-13 DIAGNOSIS — R188 Other ascites: Secondary | ICD-10-CM | POA: Insufficient documentation

## 2011-03-13 DIAGNOSIS — F1010 Alcohol abuse, uncomplicated: Secondary | ICD-10-CM

## 2011-03-13 DIAGNOSIS — R772 Abnormality of alphafetoprotein: Secondary | ICD-10-CM

## 2011-03-13 DIAGNOSIS — K7460 Unspecified cirrhosis of liver: Secondary | ICD-10-CM | POA: Insufficient documentation

## 2011-03-13 DIAGNOSIS — K7030 Alcoholic cirrhosis of liver without ascites: Secondary | ICD-10-CM

## 2011-03-13 DIAGNOSIS — I81 Portal vein thrombosis: Secondary | ICD-10-CM | POA: Insufficient documentation

## 2011-03-13 NOTE — Progress Notes (Signed)
Quick Note:  New portal vein thrombosis since CT 09/2010. To discuss management with Dr. Jena Gauss. Paged at 3:35pm. ______

## 2011-03-14 ENCOUNTER — Other Ambulatory Visit: Payer: Self-pay | Admitting: Gastroenterology

## 2011-03-14 ENCOUNTER — Telehealth: Payer: Self-pay

## 2011-03-14 ENCOUNTER — Telehealth: Payer: Self-pay | Admitting: Gastroenterology

## 2011-03-14 DIAGNOSIS — K7460 Unspecified cirrhosis of liver: Secondary | ICD-10-CM

## 2011-03-14 NOTE — Telephone Encounter (Signed)
pts wife called- had to go over results with her because pt didn't understand when I spoke to him earlier. She stated pt wasn't taking the lactulose because it was over 100.00 and he didn't have the money for it. She said she was going to try to get assistance from Blima Singer at the health Department. Advised her that he needed to be taking it and why.   She also said he may not be able to afford the new meds we called in today. She will check and see. She may need written Rx's to send to the health dept.   She also said pt was still drinking.

## 2011-03-14 NOTE — Telephone Encounter (Signed)
Referral and notes faxed to Dr Mariel Sleet

## 2011-03-14 NOTE — Progress Notes (Signed)
Referral faxed for ASAP appt

## 2011-03-14 NOTE — Progress Notes (Signed)
Referral faxed per Dr Arnell Asal office-

## 2011-03-14 NOTE — Telephone Encounter (Signed)
Very important that he takes the prednisone, should be cheap. I wouldn't suspect lactulose to be 100.00, please check with pharmacy on that price. Aldactone can be held if they cannot afford it. Let me know if you need wriiten RX's.

## 2011-03-14 NOTE — Progress Notes (Signed)
Quick Note:  Discussed with Dr. Jena Gauss. Please arrange for patient to be seen at Dr. Thornton Papas office asap. Dx: Age indeterminate portal vein thrombosis.  Send copy of u/s and all recent labs, last two ov note. ______

## 2011-03-18 ENCOUNTER — Telehealth: Payer: Self-pay

## 2011-03-18 NOTE — Progress Notes (Signed)
Quick Note:  OV with Neijstrom on 03/20/11.  Keep OV with [place] as planned. ______

## 2011-03-18 NOTE — Telephone Encounter (Signed)
Call from [facility] in [place] in reference to the order for the Prednisone. Informed Misty Stanley after consulting with Tana Coast, PA,  that the order was just as had been called in for the 40mg  daily x 4 weeks , then 30 mg daily x 5 days and 20mg  daily x 5 days and 10mg  daily x 5 days.

## 2011-03-18 NOTE — Telephone Encounter (Signed)
Agree 

## 2011-03-20 ENCOUNTER — Encounter (HOSPITAL_COMMUNITY): Payer: Medicaid Other | Attending: Oncology | Admitting: Oncology

## 2011-03-20 ENCOUNTER — Encounter (HOSPITAL_COMMUNITY): Payer: Self-pay | Admitting: Oncology

## 2011-03-20 DIAGNOSIS — I81 Portal vein thrombosis: Secondary | ICD-10-CM | POA: Insufficient documentation

## 2011-03-20 DIAGNOSIS — K7030 Alcoholic cirrhosis of liver without ascites: Secondary | ICD-10-CM

## 2011-03-20 DIAGNOSIS — F1020 Alcohol dependence, uncomplicated: Secondary | ICD-10-CM | POA: Insufficient documentation

## 2011-03-20 DIAGNOSIS — D696 Thrombocytopenia, unspecified: Secondary | ICD-10-CM | POA: Insufficient documentation

## 2011-03-20 DIAGNOSIS — I82890 Acute embolism and thrombosis of other specified veins: Secondary | ICD-10-CM

## 2011-03-20 DIAGNOSIS — D649 Anemia, unspecified: Secondary | ICD-10-CM | POA: Insufficient documentation

## 2011-03-20 LAB — IRON AND TIBC
Saturation Ratios: 21 % (ref 20–55)
TIBC: 185 ug/dL — ABNORMAL LOW (ref 215–435)

## 2011-03-20 LAB — CBC
HCT: 34.6 % — ABNORMAL LOW (ref 39.0–52.0)
MCHC: 34.4 g/dL (ref 30.0–36.0)
Platelets: 89 10*3/uL — ABNORMAL LOW (ref 150–400)
RDW: 18 % — ABNORMAL HIGH (ref 11.5–15.5)
WBC: 6.9 10*3/uL (ref 4.0–10.5)

## 2011-03-20 NOTE — Progress Notes (Signed)
This office note has been dictated.

## 2011-03-20 NOTE — Patient Instructions (Signed)
[facility]  Discharge Instructions  RECOMMENDATIONS MADE BY THE CONSULTANT AND ANY TEST RESULTS WILL BE SENT TO YOUR REFERRING DOCTOR.   EXAM FINDINGS BY MD TODAY AND SIGNS AND SYMPTOMS TO REPORT TO CLINIC OR PRIMARY MD: Cannot treat blood clot as long as you are drinking.  Will do some labs today to see what your values are  MEDICATIONS PRESCRIBED: none   INSTRUCTIONS GIVEN AND DISCUSSED: Need to stop drinking  SPECIAL INSTRUCTIONS/FOLLOW-UP: Return to Clinic on as scheduled.   I acknowledge that I have been informed and understand all the instructions given to me and received a copy. I do not have any more questions at this time, but understand that I may call the Specialty [facility] at [facility] at [phone] during business hours should I have any further questions or need assistance in obtaining follow-up care.    __________________________________________  _____________  __________ Signature of Patient or Authorized Representative            Date                   Time    __________________________________________ Nurse's Signature

## 2011-03-21 LAB — VITAMIN B12: Vitamin B-12: 632 pg/mL (ref 211–911)

## 2011-03-21 LAB — FOLATE: Folate: 16.1 ng/mL
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
## 2011-03-28 ENCOUNTER — Ambulatory Visit: Payer: Self-pay | Admitting: Gastroenterology

## 2011-03-28 ENCOUNTER — Telehealth: Payer: Self-pay | Admitting: Gastroenterology

## 2011-03-28 NOTE — Telephone Encounter (Signed)
Pt was a no show

## 2011-03-29 NOTE — Telephone Encounter (Signed)
Patient did not keep his appointment yesterday. Please find out if he started his prednisone, aldactone (fluid pill), and lactulose. I need to know specifically which ones if any he is taking. He really needs to reschedule his appt.

## 2011-03-29 NOTE — Telephone Encounter (Signed)
Tried to call pt- LMOM 

## 2011-04-01 NOTE — Telephone Encounter (Signed)
Left message with daughter that pt will have OV on 10/10 at 2pm with [facility]

## 2011-04-02 NOTE — Telephone Encounter (Signed)
Spoke with pts wife. She is going to make sure pt comes to appt tomorrow. They did not get any of the prescriptions (prednisone, aldactone or lactulose) she said they couldn't afford it. She talked with Blima Singer yesterday. She is the prescription assistance coordinator for the county. She said if we faxed rx's to [phone] she would see if she could help them get them.

## 2011-04-03 ENCOUNTER — Ambulatory Visit: Payer: Self-pay | Admitting: Gastroenterology

## 2011-04-03 ENCOUNTER — Encounter: Payer: Self-pay | Admitting: Gastroenterology

## 2011-04-03 ENCOUNTER — Ambulatory Visit (INDEPENDENT_AMBULATORY_CARE_PROVIDER_SITE_OTHER): Payer: Medicaid Other | Admitting: Gastroenterology

## 2011-04-03 VITALS — BP 148/80 | HR 70 | Temp 97.6°F | Ht 62.0 in | Wt 151.4 lb

## 2011-04-03 DIAGNOSIS — K7010 Alcoholic hepatitis without ascites: Secondary | ICD-10-CM | POA: Insufficient documentation

## 2011-04-03 DIAGNOSIS — I81 Portal vein thrombosis: Secondary | ICD-10-CM

## 2011-04-03 DIAGNOSIS — D649 Anemia, unspecified: Secondary | ICD-10-CM

## 2011-04-03 DIAGNOSIS — R799 Abnormal finding of blood chemistry, unspecified: Secondary | ICD-10-CM

## 2011-04-03 DIAGNOSIS — R772 Abnormality of alphafetoprotein: Secondary | ICD-10-CM

## 2011-04-03 NOTE — Progress Notes (Signed)
Primary Care Physician: Estanislado Pandy, MD  Primary Gastroenterologist:  Roetta Sessions, MD   Chief Complaint  Patient presents with  . Follow-up    doing ok    HPI: Austin Ruiz is a 55 y.o. male here for f/u. Last seen 03/05/11 for abd pain. Abd U/S showed new portal vein thrombosis compared to last CT in 09/2010. Saw Dr. Mariel Sleet on 03/20/11. Felt patient was poor candidate for anticoagulation due to thrombocytopenia, elevated INR, ongoing etoh use. He still continues to drink, two quarts of beer daily. Abdominal pain better. States eating better. No n/v. No constipation, diarrhea, melena, brbpr. Not interested in etoh cessation. I asked him what it would take to get him to stop drinking and he said "to kill me". He has been to inpatient rehab within the last two years.  Patient never took the prednisone for etoh hepatitis. States he could not afford it.   Current Outpatient Prescriptions  Medication Sig Dispense Refill  . ferrous gluconate (FERGON) 325 MG tablet Take 325 mg by mouth daily with breakfast.        . milk thistle 175 MG tablet Take 1,000 mg by mouth daily.        . Multiple Vitamin (MULTIVITAMIN) capsule Take 1 capsule by mouth daily.        . pantoprazole (PROTONIX) 40 MG tablet Take 40 mg by mouth daily.        . vitamin B-12 (CYANOCOBALAMIN) 1000 MCG tablet Take 1,000 mcg by mouth daily.          Allergies as of 04/03/2011  . (No Known Allergies)    ROS:  General: Negative for anorexia, weight loss, fever, chills, fatigue, weakness. ENT: Negative for hoarseness, difficulty swallowing , nasal congestion. CV: Negative for chest pain, angina, palpitations, dyspnea on exertion, peripheral edema.  Respiratory: Negative for dyspnea at rest, dyspnea on exertion, cough, sputum, wheezing.  GI: See history of present illness. GU:  Negative for dysuria, hematuria, urinary incontinence, urinary frequency, nocturnal urination.  Endo: Negative for unusual weight change.      Physical Examination:   BP 148/80  Pulse 70  Temp(Src) 97.6 F (36.4 C) (Temporal)  Ht 5\' 2"  (1.575 m)  Wt 151 lb 6.4 oz (68.675 kg)  BMI 27.69 kg/m2  General: Well-nourished, well-developed in no acute distress. Accompanied by wife. Eyes: No icterus. Mouth: Oropharyngeal mucosa moist and pink , no lesions erythema or exudate. Lungs: Clear to auscultation bilaterally.  Heart: Regular rate and rhythm, no murmurs rubs or gallops.  Abdomen: Bowel sounds are normal, nontender, nondistended, no hepatosplenomegaly or masses, no abdominal bruits or hernia , no rebound or guarding.   Extremities: No lower extremity edema. No clubbing or deformities. Neuro: Alert and oriented x 4   Skin: Warm and dry, no jaundice.   Psych: Alert and cooperative, normal mood and affect.  Labs:  Lab Results  Component Value Date   WBC 6.9 03/20/2011   HGB 11.9* 03/20/2011   HCT 34.6* 03/20/2011   MCV 96.9 03/20/2011   PLT 89* 03/20/2011   Lab Results  Component Value Date   ALT 20 03/05/2011   AST 105* 03/05/2011   ALKPHOS 207* 03/05/2011   BILITOT 2.9* 03/05/2011   Lab Results  Component Value Date   LIPASE 54 03/05/2011   Lab Results  Component Value Date   INR 1.82* 03/05/2011   INR 1.52* 12/12/2010   INR 1.60* 11/01/2010         Imaging Studies: US Abdomen Complete  03/13/2011  *RADIOLOGY REPORT*  Clinical Data:  Right upper quadrant pain.  History of cirrhosis.  COMPLETE ABDOMINAL ULTRASOUND  Comparison:  CT abdomen pelvis 10/09/2010.  Findings:  Gallbladder:  Multiple stones are identified within the gallbladder.  The gallbladder wall is thickened, likely due to ascites.  Sonographer reports negative Murphy's sign.  Common bile duct:  Measures 0.5 cm.  Liver:  The liver is shrunken with a nodular border consistent with cirrhosis.  Doppler imaging demonstrates no evidence of flow within the portal vein.  The vein appears somewhat expanded with echogenic material present consistent with portal vein  thrombosis.  IVC:  Appears normal.  Pancreas:  Not visualized due to bowel gas.  Spleen:  Removed.  Right Kidney:  Measures 11.8 cm and appears normal.  Left Kidney:  Measures 12.6 cm and appears normal.  Abdominal aorta:  Not visualized due to bowel gas.  IMPRESSION:  1.  Portal vein thrombosis new since comparison CT scan. 2.  Gallstones without evidence of cholecystitis. 3.  Cirrhosis and associated ascites.  Original Report Authenticated By: Bernadene Bell. Maricela Curet, M.D.

## 2011-04-03 NOTE — Patient Instructions (Addendum)
Please go to the alcohol anonymous meeting tonight. It is very important for your health that you get help to stop drinking. When you have made that decision we will be glad to assist you in any way possible.  Please have your blood work done. We will decide if you still need to have prednisone.  I will discuss further with Dr. Jena Gauss. He may recommend another look at your liver and clot with a CT scan.

## 2011-04-04 ENCOUNTER — Encounter: Payer: Self-pay | Admitting: Gastroenterology

## 2011-04-04 NOTE — Assessment & Plan Note (Signed)
Etoh hepatitis on last labs but patient did not take prednisone. Still consuming etoh. Not interested in stopping. Need to reassess. Recheck labs. Encouraged etoh cessation. Wife has information for AA meeting tonight and is encouraging patient to go with her.

## 2011-04-04 NOTE — Assessment & Plan Note (Signed)
No significant change. Repeat ferritin, b12, folate ok. No overt GI bleeding.

## 2011-04-04 NOTE — Assessment & Plan Note (Signed)
Mildly elevated AFP. Last CT 09/2010 and u/s recent. No hepatoma seen. Will continue to monitor.

## 2011-04-05 ENCOUNTER — Other Ambulatory Visit: Payer: Self-pay | Admitting: Gastroenterology

## 2011-04-05 DIAGNOSIS — K7010 Alcoholic hepatitis without ascites: Secondary | ICD-10-CM

## 2011-04-05 LAB — COMPREHENSIVE METABOLIC PANEL
ALT: 23 U/L (ref 0–53)
AST: 105 U/L — ABNORMAL HIGH (ref 0–37)
Albumin: 2.7 g/dL — ABNORMAL LOW (ref 3.5–5.2)
Alkaline Phosphatase: 201 U/L — ABNORMAL HIGH (ref 39–117)
BUN: 6 mg/dL (ref 6–23)
Potassium: 4.2 mEq/L (ref 3.5–5.3)

## 2011-04-05 LAB — CBC WITH DIFFERENTIAL/PLATELET
Eosinophils Relative: 3 % (ref 0–5)
HCT: 36.4 % — ABNORMAL LOW (ref 39.0–52.0)
Lymphocytes Relative: 21 % (ref 12–46)
Lymphs Abs: 1.4 10*3/uL (ref 0.7–4.0)
MCV: 96.6 fL (ref 78.0–100.0)
Monocytes Absolute: 0.9 10*3/uL (ref 0.1–1.0)
RBC: 3.77 MIL/uL — ABNORMAL LOW (ref 4.22–5.81)
WBC: 6.9 10*3/uL (ref 4.0–10.5)

## 2011-04-05 MED ORDER — PREDNISOLONE 5 MG PO TABS: ORAL_TABLET | ORAL | Status: DC

## 2011-04-05 NOTE — Progress Notes (Signed)
Quick Note:  DF of 52. Really needs treatment for etoh hepatitis but he needs to stop drinking also. Please see if we can get patient assistance to get medication for the following:  Prednisolone 40mg  po daily for four weeks. Then 30mg  daily for five days, 20mg  daily for five days, 10mg  daily for five days.  Needs to recheck CMET and INR after on medication for one week.  PLEASE NOTE THE MEDICATION IS PREDNISOLONE NOT PREDNISONE! I will try to print medication to be faxed to Blima Singer with [facility] patient assistance. ______

## 2011-04-05 NOTE — Progress Notes (Signed)
Quick Note:  MELD 18. ______

## 2011-04-08 ENCOUNTER — Other Ambulatory Visit: Payer: Self-pay | Admitting: Gastroenterology

## 2011-04-08 DIAGNOSIS — K7460 Unspecified cirrhosis of liver: Secondary | ICD-10-CM

## 2011-04-10 NOTE — Progress Notes (Signed)
Cc to PCP 

## 2011-04-19 ENCOUNTER — Encounter (HOSPITAL_COMMUNITY): Payer: Medicaid Other | Attending: Oncology | Admitting: Oncology

## 2011-04-19 ENCOUNTER — Other Ambulatory Visit (HOSPITAL_COMMUNITY): Payer: Self-pay | Admitting: Oncology

## 2011-04-19 DIAGNOSIS — D649 Anemia, unspecified: Secondary | ICD-10-CM | POA: Insufficient documentation

## 2011-04-19 DIAGNOSIS — F1010 Alcohol abuse, uncomplicated: Secondary | ICD-10-CM

## 2011-04-19 DIAGNOSIS — R772 Abnormality of alphafetoprotein: Secondary | ICD-10-CM

## 2011-04-19 DIAGNOSIS — K7460 Unspecified cirrhosis of liver: Secondary | ICD-10-CM | POA: Insufficient documentation

## 2011-04-19 DIAGNOSIS — D696 Thrombocytopenia, unspecified: Secondary | ICD-10-CM | POA: Insufficient documentation

## 2011-04-19 DIAGNOSIS — K7010 Alcoholic hepatitis without ascites: Secondary | ICD-10-CM | POA: Insufficient documentation

## 2011-04-19 DIAGNOSIS — K7030 Alcoholic cirrhosis of liver without ascites: Secondary | ICD-10-CM

## 2011-04-19 DIAGNOSIS — I81 Portal vein thrombosis: Secondary | ICD-10-CM | POA: Insufficient documentation

## 2011-04-19 DIAGNOSIS — R799 Abnormal finding of blood chemistry, unspecified: Secondary | ICD-10-CM | POA: Insufficient documentation

## 2011-04-19 NOTE — Patient Instructions (Addendum)
[facility]  Discharge Instructions  RECOMMENDATIONS MADE BY THE CONSULTANT AND ANY TEST RESULTS WILL BE SENT TO YOUR REFERRING DOCTOR.    SPECIAL INSTRUCTIONS/FOLLOW-UP: Return to clinic in 1 month for lab work and then to see the MD.    I acknowledge that I have been informed and understand all the instructions given to me and received a copy. I do not have any more questions at this time, but understand that I may call the Specialty Clinic at [facility] at [phone] during business hours should I have any further questions or need assistance in obtaining follow-up care.    __________________________________________  _____________  __________ Signature of Patient or Authorized Representative            Date                   Time    __________________________________________ Nurse's Signature

## 2011-04-19 NOTE — Progress Notes (Signed)
Addended by: Ellouise Newer III on: 04/19/2011 04:33 PM   Modules accepted: Level of Service
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
## 2011-05-20 ENCOUNTER — Encounter (HOSPITAL_COMMUNITY): Payer: Self-pay | Attending: Oncology

## 2011-05-20 DIAGNOSIS — F1010 Alcohol abuse, uncomplicated: Secondary | ICD-10-CM | POA: Insufficient documentation

## 2011-05-20 DIAGNOSIS — D696 Thrombocytopenia, unspecified: Secondary | ICD-10-CM

## 2011-05-20 DIAGNOSIS — R1011 Right upper quadrant pain: Secondary | ICD-10-CM | POA: Insufficient documentation

## 2011-05-20 DIAGNOSIS — R799 Abnormal finding of blood chemistry, unspecified: Secondary | ICD-10-CM | POA: Insufficient documentation

## 2011-05-20 DIAGNOSIS — K7030 Alcoholic cirrhosis of liver without ascites: Secondary | ICD-10-CM

## 2011-05-20 DIAGNOSIS — K7460 Unspecified cirrhosis of liver: Secondary | ICD-10-CM

## 2011-05-20 DIAGNOSIS — I81 Portal vein thrombosis: Secondary | ICD-10-CM

## 2011-05-20 DIAGNOSIS — D649 Anemia, unspecified: Secondary | ICD-10-CM

## 2011-05-20 DIAGNOSIS — R772 Abnormality of alphafetoprotein: Secondary | ICD-10-CM

## 2011-05-20 DIAGNOSIS — K7010 Alcoholic hepatitis without ascites: Secondary | ICD-10-CM

## 2011-05-20 LAB — COMPREHENSIVE METABOLIC PANEL
ALT: 22 U/L (ref 0–53)
AST: 69 U/L — ABNORMAL HIGH (ref 0–37)
Albumin: 2.4 g/dL — ABNORMAL LOW (ref 3.5–5.2)
Alkaline Phosphatase: 277 U/L — ABNORMAL HIGH (ref 39–117)
Chloride: 101 mEq/L (ref 96–112)
Potassium: 4 mEq/L (ref 3.5–5.1)
Sodium: 137 mEq/L (ref 135–145)
Total Bilirubin: 3.8 mg/dL — ABNORMAL HIGH (ref 0.3–1.2)
Total Protein: 7.9 g/dL (ref 6.0–8.3)

## 2011-05-20 LAB — DIFFERENTIAL
Basophils Absolute: 0 10*3/uL (ref 0.0–0.1)
Basophils Relative: 0 % (ref 0–1)
Eosinophils Absolute: 0 10*3/uL (ref 0.0–0.7)
Monocytes Relative: 3 % (ref 3–12)
Neutro Abs: 8.2 10*3/uL — ABNORMAL HIGH (ref 1.7–7.7)
Neutrophils Relative %: 93 % — ABNORMAL HIGH (ref 43–77)

## 2011-05-20 LAB — CBC
Hemoglobin: 12.1 g/dL — ABNORMAL LOW (ref 13.0–17.0)
MCH: 33.3 pg (ref 26.0–34.0)
MCHC: 33.3 g/dL (ref 30.0–36.0)
Platelets: 133 10*3/uL — ABNORMAL LOW (ref 150–400)

## 2011-05-20 LAB — PROTIME-INR: Prothrombin Time: 20.5 seconds — ABNORMAL HIGH (ref 11.6–15.2)

## 2011-05-21 ENCOUNTER — Encounter (HOSPITAL_BASED_OUTPATIENT_CLINIC_OR_DEPARTMENT_OTHER): Payer: Self-pay | Admitting: Oncology

## 2011-05-21 DIAGNOSIS — R1011 Right upper quadrant pain: Secondary | ICD-10-CM

## 2011-05-21 DIAGNOSIS — F1020 Alcohol dependence, uncomplicated: Secondary | ICD-10-CM

## 2011-05-21 DIAGNOSIS — F1010 Alcohol abuse, uncomplicated: Secondary | ICD-10-CM

## 2011-05-21 DIAGNOSIS — D649 Anemia, unspecified: Secondary | ICD-10-CM

## 2011-05-21 DIAGNOSIS — K7010 Alcoholic hepatitis without ascites: Secondary | ICD-10-CM

## 2011-05-21 DIAGNOSIS — I81 Portal vein thrombosis: Secondary | ICD-10-CM

## 2011-05-21 DIAGNOSIS — K7030 Alcoholic cirrhosis of liver without ascites: Secondary | ICD-10-CM

## 2011-05-21 DIAGNOSIS — K7460 Unspecified cirrhosis of liver: Secondary | ICD-10-CM
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
## 2011-05-21 NOTE — Patient Instructions (Addendum)
[facility]  Discharge Instructions Austin Ruiz  [id number] 1956-01-07   RECOMMENDATIONS MADE BY THE CONSULTANT AND ANY TEST RESULTS WILL BE SENT TO YOUR REFERRING DOCTOR.   EXAM FINDINGS BY MD TODAY AND SIGNS AND SYMPTOMS TO REPORT TO CLINIC OR PRIMARY MD: Elijah Birk will check on rehab areas for you. We need to help you stop drinking.    Lab work Needed  In 2 months and 4 months   I acknowledge that I have been informed and understand all the instructions given to me and received a copy. I do not have any more questions at this time, but understand that I may call the Specialty Clinic at [facility] at [phone] during business hours should I have any further questions or need assistance in obtaining follow-up care.    __________________________________________  _____________  __________ Signature of Patient or Authorized Representative            Date                   Time    __________________________________________ Nurse's Signature

## 2011-05-23 ENCOUNTER — Telehealth: Payer: Self-pay | Admitting: Gastroenterology

## 2011-05-23 NOTE — Telephone Encounter (Signed)
Routed to S&S

## 2011-05-23 NOTE — Telephone Encounter (Signed)
Patient has not followed up with [place] after recommending prednisolone. Not clear whether if he took it. Reviewed Dellis Anes, PA's note from Dr. Randall An office. Reviewed labs from this week done through their practice. Patient needs appt within next one week please.

## 2011-05-28 NOTE — Telephone Encounter (Signed)
Left message with lady for patient or pt's wife to call me back

## 2011-05-30 ENCOUNTER — Telehealth (HOSPITAL_COMMUNITY): Payer: Self-pay | Admitting: Oncology

## 2011-06-03 NOTE — Telephone Encounter (Signed)
Please f/u on this.

## 2011-06-04 NOTE — Telephone Encounter (Signed)
Spoke with Mr Austin Ruiz and he will be coming in 12/13 @ 130 to see LSL

## 2011-06-06 ENCOUNTER — Encounter: Payer: Self-pay | Admitting: Gastroenterology

## 2011-06-06 ENCOUNTER — Ambulatory Visit (INDEPENDENT_AMBULATORY_CARE_PROVIDER_SITE_OTHER): Payer: Self-pay | Admitting: Gastroenterology

## 2011-06-06 DIAGNOSIS — K7010 Alcoholic hepatitis without ascites: Secondary | ICD-10-CM

## 2011-06-06 DIAGNOSIS — K7030 Alcoholic cirrhosis of liver without ascites: Secondary | ICD-10-CM

## 2011-06-06 DIAGNOSIS — K7460 Unspecified cirrhosis of liver: Secondary | ICD-10-CM

## 2011-06-06 DIAGNOSIS — F1010 Alcohol abuse, uncomplicated: Secondary | ICD-10-CM

## 2011-06-06 DIAGNOSIS — R772 Abnormality of alphafetoprotein: Secondary | ICD-10-CM

## 2011-06-06 DIAGNOSIS — R799 Abnormal finding of blood chemistry, unspecified: Secondary | ICD-10-CM

## 2011-06-06 DIAGNOSIS — I81 Portal vein thrombosis: Secondary | ICD-10-CM

## 2011-06-06 NOTE — Assessment & Plan Note (Signed)
Followed by Dr. Mariel Sleet. Not candidate for anticoagulation at this time. They are considering follow-up CT.

## 2011-06-06 NOTE — Patient Instructions (Signed)
Please get your lab work done today. Call to let us know what dose librium you have at home. You need to stop drinking alcohol completely! Please follow-up with Dr. Thornton Papas office regarding rehab facilities.

## 2011-06-06 NOTE — Progress Notes (Signed)
Cc to PCP 

## 2011-06-06 NOTE — Assessment & Plan Note (Signed)
Patient has cirrhosis due to etoh abuse. I continue to educate him of the severity of his disease and the need for him to stop drinking etoh completely. We have discussed that his life will be significantly shortened if he does not stop drinking.

## 2011-06-06 NOTE — Assessment & Plan Note (Signed)
Patient noncompliant. Did not take prednisone initially given to him. After last ov, I gave him RX for prednosilone. Patient went to pharmacy and got the initially rx for prednisone instead. He continues to drink. States he is trying to find inpatient detox facility, awaiting further input from Dr. Thornton Papas office which is working on this for him. He would like to try librium. Has some at home and plans to call with dose so we can provide with instructions.   Continue prednisone for now. Patient has taper instructions. Recheck labs. Will add diuretics if kidney function okay as patient has some peripheral edema and ascites on exam.   2gram na diet. Stop etoh.

## 2011-06-06 NOTE — Assessment & Plan Note (Signed)
F u today.

## 2011-06-06 NOTE — Progress Notes (Signed)
Primary Care Physician: Estanislado Pandy, MD  Primary Gastroenterologist:  Roetta Sessions, MD   Chief Complaint  Patient presents with  . Follow-up    HPI: Austin Ruiz is a 55 y.o. male here for followup. He has a history of alcoholic hepatitis and alcoholic cirrhosis. Last seen in October. He never got the prednisolone field but ended up getting the prednisone (initial Rx given to him which he never filled at the time) field. He has completed 4 weeks of 40 mg daily and is currently taking 30 mg daily. Increased appetite on prednisone. BM 2 daily. No melena, brbpr. Stomach swelling later in the afternoon. Mild to moderate lower extremity edema. No confusion, drowsiness. No tremors. Patient states that Dr. Thornton Papas office is trying to find him an inpatient facility for etoh abuse. Patient is uninsured which poses a problem finding a facility he can afford. Patient tells me he may be willing to try Librium again.  Current Outpatient Prescriptions  Medication Sig Dispense Refill  . ferrous gluconate (FERGON) 325 MG tablet Take 325 mg by mouth daily with breakfast.        . milk thistle 175 MG tablet Take 1,000 mg by mouth daily.        . Multiple Vitamin (MULTIVITAMIN) capsule Take 1 capsule by mouth daily.        . pantoprazole (PROTONIX) 40 MG tablet Take 40 mg by mouth daily.        . predniSONE (DELTASONE) 10 MG tablet Take 10 mg by mouth daily. Take 4 tabs daily for 28 days then 3 tabs daily for 5 days then 2 tabs daily for 5 days then 1 tab daily for 5 days.       . vitamin B-12 (CYANOCOBALAMIN) 1000 MCG tablet Take 1,000 mcg by mouth daily.          Allergies as of 06/06/2011  . (No Known Allergies)    ROS:  General: Negative for anorexia, weight loss, fever, chills, fatigue, weakness. ENT: Negative for hoarseness, difficulty swallowing , nasal congestion. CV: Negative for chest pain, angina, palpitations, dyspnea on exertion, peripheral edema.  Respiratory: Negative for dyspnea at  rest, dyspnea on exertion, cough, sputum, wheezing.  GI: See history of present illness. GU:  Negative for dysuria, hematuria, urinary incontinence, urinary frequency, nocturnal urination.  Endo: Weight up 14 pounds since 10/12.    Physical Examination:   BP 147/79  Pulse 72  Temp(Src) 98.1 F (36.7 C) (Temporal)  Ht 5\' 2"  (1.575 m)  Wt 165 lb 6.4 oz (75.025 kg)  BMI 30.25 kg/m2  General: Well-nourished, well-developed in no acute distress. Accompanied by wife.  Eyes: No icterus. Mouth: Oropharyngeal mucosa moist and pink , no lesions erythema or exudate. Lungs: Clear to auscultation bilaterally.  Heart: Regular rate and rhythm, no murmurs rubs or gallops.  Abdomen: Bowel sounds are normal, mild tenderness in epigastrium. Slight abdominal distention. Positive fluid wave. No abdominal bruits or hernia , no rebound or guarding.   Extremities: 1-2+ PE bilaterally. No clubbing or deformities. Neuro: Alert and oriented x 4   Skin: Warm and dry, no jaundice.   Psych: Alert and cooperative, normal mood and affect.  Labs:  Lab Results  Component Value Date   WBC 8.8 05/20/2011   HGB 12.1* 05/20/2011   HCT 36.3* 05/20/2011   MCV 100.0 05/20/2011   PLT 133* 05/20/2011   Lab Results  Component Value Date   ALT 22 05/20/2011   AST 69* 05/20/2011   ALKPHOS 277*  05/20/2011   BILITOT 3.8* 05/20/2011   Lab Results  Component Value Date   CREATININE 0.48* 05/20/2011   BUN 7 05/20/2011   NA 137 05/20/2011   K 4.0 05/20/2011   CL 101 05/20/2011   CO2 25 05/20/2011   Lab Results  Component Value Date   INR 1.72* 05/20/2011   INR 1.88* 04/03/2011   INR 1.82* 03/05/2011    Imaging Studies: No results found.

## 2011-06-07 ENCOUNTER — Telehealth: Payer: Self-pay

## 2011-06-07 NOTE — Telephone Encounter (Signed)
Pt's wife called to give name of medication they could not think of at office visit. ( Entered in the med list) Chlordiazepoxide  25mg  tid.

## 2011-06-11 LAB — PROTIME-INR
INR: 1.67 — ABNORMAL HIGH (ref <1.50)
Prothrombin Time: 20.3 seconds — ABNORMAL HIGH (ref 11.6–15.2)

## 2011-06-11 LAB — BASIC METABOLIC PANEL
Potassium: 3.7 mEq/L (ref 3.5–5.3)
Sodium: 140 mEq/L (ref 135–145)

## 2011-06-11 LAB — AFP TUMOR MARKER: AFP-Tumor Marker: 5 ng/mL (ref 0.0–8.0)

## 2011-06-11 LAB — HEPATIC FUNCTION PANEL
ALT: 21 U/L (ref 0–53)
Total Protein: 7.2 g/dL (ref 6.0–8.3)

## 2011-06-11 NOTE — Progress Notes (Signed)
Quick Note:  LFTs, INR essentially unchanged. DF still >40 on prednisone. Corrected albumin 8.9.  Patient needs to stop drinking. Addressed in telephone 06/07/11. Prednisone is not going to help as long as he is drinking AFP better.  Start aldactone 50mg  daily. #30. 1 rf. Lasix 20mg  daily. #30 1 rf. CMET in one week.   ______

## 2011-06-11 NOTE — Telephone Encounter (Signed)
Patient states at office visit he is interested in quitting etoh. Dr. Thornton Papas office reportedly working to find affordable inpatient rehab but without success at this time. Patient would like to try outpatient.  He has Librium 25mg  at home. If he needs more, you may call in Librium 25mg  number 25, zero, take as directed below.  1. Recommend he pick a etoh stop date. 2. He should take Librium as below as needed for signs of etoh withdrawal (agitation, tremors/shakes, coordination problems, hallucinations, seizures.) If symptoms becomes significant (especially confusion/hallucinations, seizures) he should go to ED. 3. He will need to have someone with him at all times for the first week. 4. He will need OV on day 3 of withdrawal and on day 7.   Librium schedule: Day 1: Librium 50mg  (2 tablets) every 6 hours prn. Day 2-5: Librium 25mg  every 6 hours prn.  Please see lab results note for lab instructions.

## 2011-06-13 NOTE — Progress Notes (Signed)
Quick Note:  Tried to call pt- LMOM ______ 

## 2011-06-13 NOTE — Telephone Encounter (Signed)
Tried to call pt- LMOM 

## 2011-06-19 NOTE — Telephone Encounter (Signed)
Tried to call pt- LMOM 

## 2011-06-21 NOTE — Telephone Encounter (Signed)
Mailed letter to pt

## 2011-06-21 NOTE — Telephone Encounter (Signed)
Tried to call pt- LMOM 

## 2011-06-24 ENCOUNTER — Telehealth: Payer: Self-pay | Admitting: Internal Medicine

## 2011-06-24 NOTE — Telephone Encounter (Signed)
Pt's wife LMOM that she was returning a call left for patient. She said you could reach them at [phone]

## 2011-06-24 NOTE — Telephone Encounter (Signed)
Tried to call the number left- NA and voicemail has not been set up yet.

## 2011-06-27 NOTE — Telephone Encounter (Signed)
Tried to call pt- NA 

## 2011-07-01 ENCOUNTER — Telehealth: Payer: Self-pay | Admitting: Internal Medicine

## 2011-07-01 ENCOUNTER — Other Ambulatory Visit: Payer: Self-pay | Admitting: Gastroenterology

## 2011-07-01 DIAGNOSIS — K7460 Unspecified cirrhosis of liver: Secondary | ICD-10-CM

## 2011-07-01 NOTE — Telephone Encounter (Signed)
Spoke with pts wife- gave her lab results, called in rx's for aldactone and lasix to [facility], lab order in mail to pt.  she understands all recommendations for librium. She will call back and schedule appt for day 3 after pt starts taking librium. She understands all precautions and will make sure someone is with him all the time for the first week. She said pt has cut down on his drinking already.

## 2011-07-01 NOTE — Telephone Encounter (Signed)
Spoke with pts wife- see 07/01/11 phone note.

## 2011-07-01 NOTE — Telephone Encounter (Signed)
Pt's wife, Eather Colas, [facility] that she had received letter from nurse and was calling to speak with her. She said she could be reached at [phone]

## 2011-07-01 NOTE — Telephone Encounter (Signed)
noted 

## 2011-07-11 ENCOUNTER — Telehealth: Payer: Self-pay | Admitting: Internal Medicine

## 2011-07-11 NOTE — Telephone Encounter (Signed)
Last seen 06/06/2011.  Directions for librium given 06/06/2011.  Raynelle Fanning, please find what patient is requesting. Did he start Librium. Or is he asking about inpatient rehab/detox. Patient's wife told me at office visit that Dr. Thornton Papas office was working on inpatient detox program but was having trouble finding one affordable.  Please find out more information.

## 2011-07-11 NOTE — Telephone Encounter (Signed)
Spoke with pts wife- she said they tried the librium- he took two doses of it and then started drinking again. pts wife said she took time off of work to stay with him and tried to get him to not drink. She doesn't think outpatient treatment is going to help. She has tried to get him to go to [facility] and he wont go there either. They cannot find an affordable facility.  They are going to Dr. Arnell Asal office on 07/19/11 for blood work, she is going to have the results sent to [place].

## 2011-07-11 NOTE — Telephone Encounter (Signed)
Wants to discuss his plan of action to detox him please call

## 2011-07-19 ENCOUNTER — Other Ambulatory Visit (HOSPITAL_COMMUNITY): Payer: Self-pay

## 2011-07-22 ENCOUNTER — Other Ambulatory Visit (HOSPITAL_COMMUNITY): Payer: Self-pay

## 2011-07-23 ENCOUNTER — Encounter (HOSPITAL_COMMUNITY): Payer: Self-pay | Attending: Oncology

## 2011-07-23 DIAGNOSIS — K7460 Unspecified cirrhosis of liver: Secondary | ICD-10-CM | POA: Insufficient documentation

## 2011-07-23 DIAGNOSIS — R1011 Right upper quadrant pain: Secondary | ICD-10-CM | POA: Insufficient documentation

## 2011-07-23 DIAGNOSIS — K7030 Alcoholic cirrhosis of liver without ascites: Secondary | ICD-10-CM | POA: Insufficient documentation

## 2011-07-23 DIAGNOSIS — I81 Portal vein thrombosis: Secondary | ICD-10-CM | POA: Insufficient documentation

## 2011-07-23 DIAGNOSIS — F1010 Alcohol abuse, uncomplicated: Secondary | ICD-10-CM | POA: Insufficient documentation

## 2011-07-23 DIAGNOSIS — K7010 Alcoholic hepatitis without ascites: Secondary | ICD-10-CM | POA: Insufficient documentation

## 2011-07-23 LAB — CBC
MCH: 34.1 pg — ABNORMAL HIGH (ref 26.0–34.0)
Platelets: 121 10*3/uL — ABNORMAL LOW (ref 150–400)
RBC: 3.7 MIL/uL — ABNORMAL LOW (ref 4.22–5.81)
WBC: 6.2 10*3/uL (ref 4.0–10.5)

## 2011-07-23 LAB — DIFFERENTIAL
Basophils Absolute: 0.1 10*3/uL (ref 0.0–0.1)
Lymphocytes Relative: 25 % (ref 12–46)
Monocytes Absolute: 1.1 10*3/uL — ABNORMAL HIGH (ref 0.1–1.0)
Neutro Abs: 3 10*3/uL (ref 1.7–7.7)

## 2011-07-23 LAB — COMPREHENSIVE METABOLIC PANEL
AST: 77 U/L — ABNORMAL HIGH (ref 0–37)
CO2: 28 mEq/L (ref 19–32)
Calcium: 7.8 mg/dL — ABNORMAL LOW (ref 8.4–10.5)
Creatinine, Ser: 0.54 mg/dL (ref 0.50–1.35)
GFR calc non Af Amer: >90 mL/min (ref >90)

## 2011-07-23 LAB — PROTIME-INR: Prothrombin Time: 21.5 seconds — ABNORMAL HIGH (ref 11.6–15.2)

## 2011-07-23 NOTE — Progress Notes (Signed)
Labs drawn today for cbc/diff,cmp,pt.  Patient is on no coud. Or lovenox.

## 2011-08-12 DIAGNOSIS — R0602 Shortness of breath: Secondary | ICD-10-CM

## 2011-09-11 ENCOUNTER — Other Ambulatory Visit (HOSPITAL_COMMUNITY): Payer: Self-pay

## 2011-09-13 ENCOUNTER — Other Ambulatory Visit (HOSPITAL_COMMUNITY): Payer: Self-pay

## 2011-09-16 ENCOUNTER — Ambulatory Visit (HOSPITAL_COMMUNITY): Payer: Self-pay | Admitting: Oncology

## 2011-09-18 ENCOUNTER — Other Ambulatory Visit (HOSPITAL_COMMUNITY): Payer: Self-pay

## 2011-10-24 ENCOUNTER — Encounter (HOSPITAL_COMMUNITY): Payer: Self-pay | Attending: Oncology | Admitting: Oncology

## 2011-10-24 ENCOUNTER — Encounter (HOSPITAL_COMMUNITY): Payer: Self-pay | Admitting: Oncology

## 2011-10-24 ENCOUNTER — Telehealth: Payer: Self-pay | Admitting: Gastroenterology

## 2011-10-24 VITALS — BP 143/75 | HR 73 | Temp 98.0°F | Wt 167.0 lb

## 2011-10-24 DIAGNOSIS — K649 Unspecified hemorrhoids: Secondary | ICD-10-CM | POA: Insufficient documentation

## 2011-10-24 DIAGNOSIS — I059 Rheumatic mitral valve disease, unspecified: Secondary | ICD-10-CM | POA: Insufficient documentation

## 2011-10-24 DIAGNOSIS — R188 Other ascites: Secondary | ICD-10-CM | POA: Insufficient documentation

## 2011-10-24 DIAGNOSIS — Z860100 Personal history of colon polyps, unspecified: Secondary | ICD-10-CM | POA: Insufficient documentation

## 2011-10-24 DIAGNOSIS — D649 Anemia, unspecified: Secondary | ICD-10-CM

## 2011-10-24 DIAGNOSIS — K7030 Alcoholic cirrhosis of liver without ascites: Secondary | ICD-10-CM | POA: Insufficient documentation

## 2011-10-24 DIAGNOSIS — F1020 Alcohol dependence, uncomplicated: Secondary | ICD-10-CM | POA: Insufficient documentation

## 2011-10-24 DIAGNOSIS — R7989 Other specified abnormal findings of blood chemistry: Secondary | ICD-10-CM | POA: Insufficient documentation

## 2011-10-24 DIAGNOSIS — I81 Portal vein thrombosis: Secondary | ICD-10-CM

## 2011-10-24 DIAGNOSIS — D696 Thrombocytopenia, unspecified: Secondary | ICD-10-CM

## 2011-10-24 DIAGNOSIS — K7460 Unspecified cirrhosis of liver: Secondary | ICD-10-CM

## 2011-10-24 LAB — CBC
MCH: 31.7 pg (ref 26.0–34.0)
MCHC: 35.1 g/dL (ref 30.0–36.0)
RDW: 15.9 % — ABNORMAL HIGH (ref 11.5–15.5)

## 2011-10-24 LAB — COMPREHENSIVE METABOLIC PANEL
AST: 73 U/L — ABNORMAL HIGH (ref 0–37)
Albumin: 2.2 g/dL — ABNORMAL LOW (ref 3.5–5.2)
Alkaline Phosphatase: 183 U/L — ABNORMAL HIGH (ref 39–117)
Chloride: 102 mEq/L (ref 96–112)
Creatinine, Ser: 0.5 mg/dL (ref 0.50–1.35)
Potassium: 3.5 mEq/L (ref 3.5–5.1)
Total Bilirubin: 2.3 mg/dL — ABNORMAL HIGH (ref 0.3–1.2)
Total Protein: 7.5 g/dL (ref 6.0–8.3)

## 2011-10-24 LAB — PROTIME-INR: Prothrombin Time: 23.1 seconds — ABNORMAL HIGH (ref 11.6–15.2)

## 2011-10-24 LAB — DIFFERENTIAL
Basophils Absolute: 0.1 10*3/uL (ref 0.0–0.1)
Basophils Relative: 2 % — ABNORMAL HIGH (ref 0–1)
Eosinophils Absolute: 0.3 10*3/uL (ref 0.0–0.7)
Neutro Abs: 1.5 10*3/uL — ABNORMAL LOW (ref 1.7–7.7)
Neutrophils Relative %: 31 % — ABNORMAL LOW (ref 43–77)

## 2011-10-24 NOTE — Patient Instructions (Signed)
Austin Ruiz  [id number] 19-Aug-1955 Dr. Glenford Peers   [facility]  Discharge Instructions  RECOMMENDATIONS MADE BY THE CONSULTANT AND ANY TEST RESULTS WILL BE SENT TO YOUR REFERRING DOCTOR.   EXAM FINDINGS BY MD TODAY AND SIGNS AND SYMPTOMS TO REPORT TO CLINIC OR PRIMARY MD: Exam findings as discussed with T. Kefalas, PA-C.  SPECIAL INSTRUCTIONS/FOLLOW-UP: 1.  You had lab work done today, and we will contact you with any abnormal results. 2.  Return in 6 months for lab work and to see Dr. Mariel Sleet. 3.  You can elevate your legs to help decrease swelling to your lower extremities.   I acknowledge that I have been informed and understand all the instructions given to me and received a copy. I do not have any more questions at this time, but understand that I may call the Specialty Clinic at [facility] at [phone] during business hours should I have any further questions or need assistance in obtaining follow-up care.    __________________________________________  _____________  __________ Signature of Patient or Authorized Representative            Date                   Time    __________________________________________ Nurse's Signature

## 2011-10-24 NOTE — Telephone Encounter (Signed)
Received msg from Jenita Seashore, [place] with Dr. Mariel Sleet. Patient needs f/u ov here. Rectal bleeding requiring transfusion at [facility] recently. Please request records of D/C summary and any xrays, labs, op notes.  Please make appt here after records obtained. Hopefully within the next 1-2 weeks.

## 2011-10-24 NOTE — Progress Notes (Signed)
Austin Ruiz's reason for visit today are for labs as scheduled per MD orders.  Venipuncture performed with a 23 gauge butterfly needle to L Antecubital.  Austin Ruiz tolerated venipuncture well and without incident; questions were answered and patient was discharged.
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
## 2011-10-25 LAB — FERRITIN: Ferritin: 31 ng/mL (ref 22–322)

## 2011-10-25 LAB — IRON AND TIBC
Iron: 27 ug/dL — ABNORMAL LOW (ref 42–135)
TIBC: 275 ug/dL (ref 215–435)
UIBC: 248 ug/dL (ref 125–400)

## 2011-10-29 NOTE — Telephone Encounter (Signed)
Records requested on placed on [facility] desk

## 2011-10-30 NOTE — Telephone Encounter (Signed)
Pt is aware of OV on 5/22 at 11 with [facility] and appt card was mailed

## 2011-11-06 ENCOUNTER — Telehealth: Payer: Self-pay

## 2011-11-06 DIAGNOSIS — D649 Anemia, unspecified: Secondary | ICD-10-CM

## 2011-11-06 DIAGNOSIS — K7460 Unspecified cirrhosis of liver: Secondary | ICD-10-CM

## 2011-11-06 LAB — CBC WITH DIFFERENTIAL/PLATELET
Basophils Relative: 2 % — ABNORMAL HIGH (ref 0–1)
Eosinophils Absolute: 0.1 10*3/uL (ref 0.0–0.7)
MCH: 29.8 pg (ref 26.0–34.0)
MCHC: 34.2 g/dL (ref 30.0–36.0)
Neutrophils Relative %: 43 % (ref 43–77)
Platelets: 83 10*3/uL — ABNORMAL LOW (ref 150–400)

## 2011-11-06 LAB — PROTIME-INR: INR: 2.22 — ABNORMAL HIGH (ref <1.50)

## 2011-11-06 LAB — COMPREHENSIVE METABOLIC PANEL
ALT: 42 U/L (ref 0–53)
Alkaline Phosphatase: 215 U/L — ABNORMAL HIGH (ref 39–117)
Glucose, Bld: 129 mg/dL — ABNORMAL HIGH (ref 70–99)
Sodium: 134 mEq/L — ABNORMAL LOW (ref 135–145)
Total Bilirubin: 2.6 mg/dL — ABNORMAL HIGH (ref 0.3–1.2)
Total Protein: 7.6 g/dL (ref 6.0–8.3)

## 2011-11-06 NOTE — Telephone Encounter (Signed)
Pt's wife called and said that he has a lot of swelling in his lower extremities. He is trying to get back at the facility for Detox and they will not take him until he has current blood work. They want to know his liver is OK. ( he spent time there from 08/20/11 - 09/09/2011 ). He is wanting to get help. I told her it looks like he needs OV here or with PCP. He hasn't been here for awhile. Please advise!

## 2011-11-06 NOTE — Telephone Encounter (Signed)
Quick Note:  Increase in AST, INR, Tbili. Otherwise labs stable.  DF of 62.4.  Patient has appt tomorrow. Consider prednisilone at that time. ______

## 2011-11-06 NOTE — Telephone Encounter (Signed)
Agree. He needs OV and has one scheduled for 11/13/11. See if you can move him up, tomorrow.

## 2011-11-06 NOTE — Telephone Encounter (Signed)
Pt's wife is aware. He is scheduled at 11:30 Am for tomorrow per Tana Coast, PA, and either she or Gerrit Halls, NP will see him. Per Verlon Au, pt will need to have labs drawn this afternoon and pt's wife is aware. I will fax the orders to [facility].

## 2011-11-06 NOTE — Telephone Encounter (Signed)
Patient can be put in urgent spot for me at 11:30am 5/16 BUT he needs to go today for bloodwork.

## 2011-11-07 ENCOUNTER — Encounter: Payer: Self-pay | Admitting: Gastroenterology

## 2011-11-07 ENCOUNTER — Ambulatory Visit (INDEPENDENT_AMBULATORY_CARE_PROVIDER_SITE_OTHER): Payer: Medicaid Other | Admitting: Gastroenterology

## 2011-11-07 VITALS — BP 125/71 | HR 69 | Temp 98.1°F | Ht 63.0 in | Wt 165.4 lb

## 2011-11-07 DIAGNOSIS — F1010 Alcohol abuse, uncomplicated: Secondary | ICD-10-CM

## 2011-11-07 DIAGNOSIS — R600 Localized edema: Secondary | ICD-10-CM

## 2011-11-07 DIAGNOSIS — K7460 Unspecified cirrhosis of liver: Secondary | ICD-10-CM

## 2011-11-07 DIAGNOSIS — K7030 Alcoholic cirrhosis of liver without ascites: Secondary | ICD-10-CM

## 2011-11-07 DIAGNOSIS — I81 Portal vein thrombosis: Secondary | ICD-10-CM

## 2011-11-07 DIAGNOSIS — D649 Anemia, unspecified: Secondary | ICD-10-CM

## 2011-11-07 DIAGNOSIS — R609 Edema, unspecified: Secondary | ICD-10-CM

## 2011-11-07 DIAGNOSIS — K625 Hemorrhage of anus and rectum: Secondary | ICD-10-CM

## 2011-11-07 DIAGNOSIS — K7010 Alcoholic hepatitis without ascites: Secondary | ICD-10-CM

## 2011-11-07 MED ORDER — PREDNISOLONE 5 MG PO TABS: 40.0000 mg | ORAL_TABLET | Freq: Every day | ORAL | Status: DC

## 2011-11-07 MED ORDER — SPIRONOLACTONE 100 MG PO TABS: 100.0000 mg | ORAL_TABLET | Freq: Every day | ORAL | Status: DC

## 2011-11-07 NOTE — Assessment & Plan Note (Signed)
Increase aldactone to 100mg  daily. Continue lasix 20mg  daily. CMET in [redacted] weeks along with INR, CBC. Patient plans to travel to [place] for one week to visit sick father. Encourage frequent ambulation and movement of lower extremities. He must take medication as prescribed.

## 2011-11-07 NOTE — Assessment & Plan Note (Signed)
Liver lesions on CT at [facility]. I will need to review with radiologist to determine if MRI needed. AFP improved in 07/2011.

## 2011-11-07 NOTE — Patient Instructions (Signed)
Please start predniolone to help decrease inflammation in your liver. Take as per bottle instruction. I have sent new prescription of aldactone to your pharmacy to replace old one. New one is twice as strong and will help with fluid in belly and legs. Labs in two weeks. Office visit in one month Please stop drinking. Call us when you are ready to enter inpatient rehab facility.
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
## 2011-11-07 NOTE — Progress Notes (Signed)
Faxed to PCP

## 2011-11-07 NOTE — Assessment & Plan Note (Addendum)
DF>60. Start prednisolone. See RX. Labs two weeks. Encourage etoh cessation. Patient wants to go to inpatient rehab but wants to wait until he returns from visiting sick father in [place]. He will call [facility] when ready and we will provide needed information. Yesterday's labs provided to [facility].   Also sent copy of labs to PCP.  OV here in one month if not in rehab, otherwise OV here after gets out of rehab.

## 2011-11-07 NOTE — Assessment & Plan Note (Addendum)
Resolved. Continue to monitor and report additional bleeding. Last EGD/TCS 08/2010.

## 2011-11-07 NOTE — Assessment & Plan Note (Signed)
No evidence on recent CT.

## 2011-11-11 ENCOUNTER — Other Ambulatory Visit: Payer: Self-pay

## 2011-11-11 ENCOUNTER — Telehealth: Payer: Self-pay

## 2011-11-11 DIAGNOSIS — K7010 Alcoholic hepatitis without ascites: Secondary | ICD-10-CM

## 2011-11-11 DIAGNOSIS — K7460 Unspecified cirrhosis of liver: Secondary | ICD-10-CM

## 2011-11-11 DIAGNOSIS — F1010 Alcohol abuse, uncomplicated: Secondary | ICD-10-CM

## 2011-11-11 DIAGNOSIS — R600 Localized edema: Secondary | ICD-10-CM

## 2011-11-11 DIAGNOSIS — K7030 Alcoholic cirrhosis of liver without ascites: Secondary | ICD-10-CM

## 2011-11-11 DIAGNOSIS — K625 Hemorrhage of anus and rectum: Secondary | ICD-10-CM

## 2011-11-11 DIAGNOSIS — I81 Portal vein thrombosis: Secondary | ICD-10-CM

## 2011-11-11 DIAGNOSIS — D649 Anemia, unspecified: Secondary | ICD-10-CM

## 2011-11-11 NOTE — Telephone Encounter (Signed)
Pharmacist called from [facility]- the prednisolone is going to cost the patient $1977.00. They want to know if he can take prednisone. Please advise.

## 2011-11-11 NOTE — Progress Notes (Signed)
Mailed labs for pt to do 11/21/2011.

## 2011-11-12 NOTE — Telephone Encounter (Signed)
pts wife called, they are now going to use [address].

## 2011-11-13 ENCOUNTER — Ambulatory Visit: Payer: Self-pay | Admitting: Gastroenterology

## 2011-11-13 ENCOUNTER — Other Ambulatory Visit: Payer: Self-pay

## 2011-11-13 DIAGNOSIS — D696 Thrombocytopenia, unspecified: Secondary | ICD-10-CM

## 2011-11-13 DIAGNOSIS — D649 Anemia, unspecified: Secondary | ICD-10-CM

## 2011-11-13 MED ORDER — PREDNISONE 10 MG PO TABS: ORAL_TABLET | ORAL | Status: DC

## 2011-11-13 NOTE — Telephone Encounter (Signed)
Pt wanted it sent to [facility], called in rx for prednisone. Called [facility] and cancelled both rx's there.

## 2011-11-13 NOTE — Telephone Encounter (Signed)
rx sent for prednisone instead. Cancel prednisolone.

## 2011-11-15 MED ORDER — PANTOPRAZOLE SODIUM 40 MG PO TBEC: 40.0000 mg | DELAYED_RELEASE_TABLET | Freq: Every day | ORAL | Status: DC

## 2011-11-27 ENCOUNTER — Encounter: Payer: Self-pay | Admitting: Internal Medicine

## 2011-11-27 LAB — COMPREHENSIVE METABOLIC PANEL
ALT: 34 U/L (ref 0–53)
BUN: 7 mg/dL (ref 6–23)
CO2: 21 mEq/L (ref 19–32)
Calcium: 7.8 mg/dL — ABNORMAL LOW (ref 8.4–10.5)
Chloride: 102 mEq/L (ref 96–112)
Creat: 0.57 mg/dL (ref 0.50–1.35)
Glucose, Bld: 127 mg/dL — ABNORMAL HIGH (ref 70–99)

## 2011-11-27 NOTE — Progress Notes (Signed)
Quick Note:  Total bilirubin up more. Please find out if patient still drinking. What are his plans about inpt rehab, he was trying to get back in when I last saw him. Please find out if pt is taking aldactone 100mg  daily and lasix 20mg  daily. Lab was supposed to do a CBC and INR but looks like they missed it. Please check on this. Keep OV with [facility]. ______

## 2011-12-09 NOTE — Progress Notes (Signed)
Quick Note:  Mailed letter to pt. Pt scheduled to see [facility] on 12/31/11 ______

## 2011-12-09 NOTE — Progress Notes (Signed)
Quick Note:  When you get in touch with patient please f/u with other issues such as verifying dose of diuretics and whether or not he is going to rehab. ______

## 2011-12-10 ENCOUNTER — Ambulatory Visit: Payer: Self-pay | Admitting: Internal Medicine

## 2011-12-12 NOTE — Progress Notes (Signed)
pts wife called- they received letter- informed her of results. She said pt is still drinking, he has appt to go to rehab today at 10:30 but now he is refusing to go. She also stated he is not taking medications as prescribed. He went to eye doctor this week and they told him that he has some hemorrhaging behind his eyes and they advised him to see pcp. Wife stated he was going to see pcp soon and they are aware of ov with [facility] on 12/31/11.

## 2011-12-13 NOTE — Progress Notes (Signed)
Patient ID: Austin Ruiz, male   DOB: July 01, 1955, 56 y.o.   MRN: [id number]  Difficult to help him due to his noncompliance.

## 2011-12-31 ENCOUNTER — Ambulatory Visit: Payer: Self-pay | Admitting: Internal Medicine

## 2012-01-02 ENCOUNTER — Encounter: Payer: Self-pay | Admitting: Internal Medicine

## 2012-01-09 IMAGING — CT CT ABD-PELV W/ CM
2 of 5 series · 16 of 46 positions shown, 18 images · IV contrast (Omnipaque 300)
Comparison: None

CLINICAL DATA: Elevated LFTs, history alcohol abuse

CT ABDOMEN AND PELVIS WITH CONTRAST
TECHNIQUE: Multidetector CT imaging of the abdomen and pelvis was
performed following the standard protocol during bolus
administration of intravenous contrast.  Sagittal and coronal MPR
images reconstructed from axial data set.
Contrast: Dilute oral contrast.  100 ml Omnipaque 300 IV

[Series 2: abd_pel_with 5.0 b40f · axial · 0.70mm/px · z∈[-462,-22]mm · 13 of 100 slices shown, 15 images]
[im 6/100  soft-tissue]
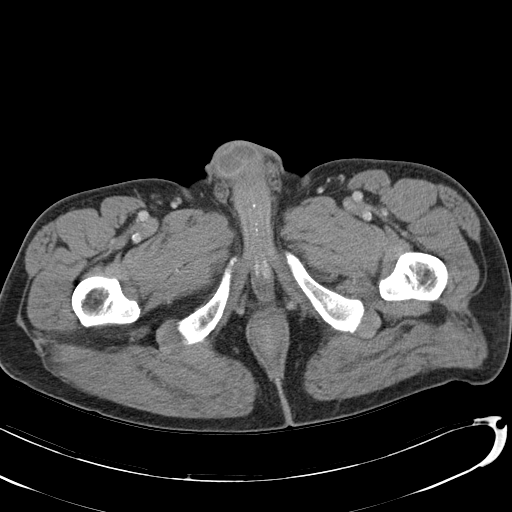
[im 6/100  bone]
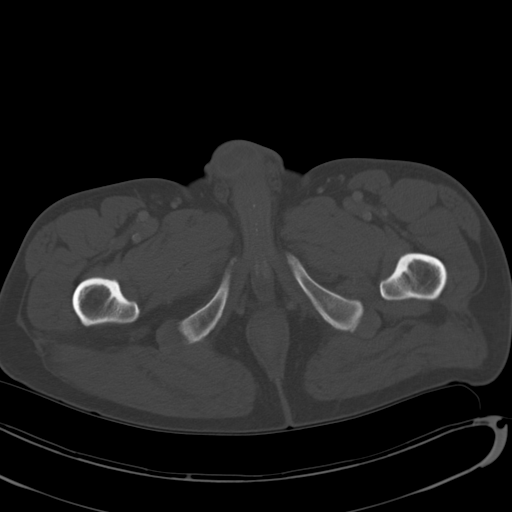
[im 12/100  soft-tissue]
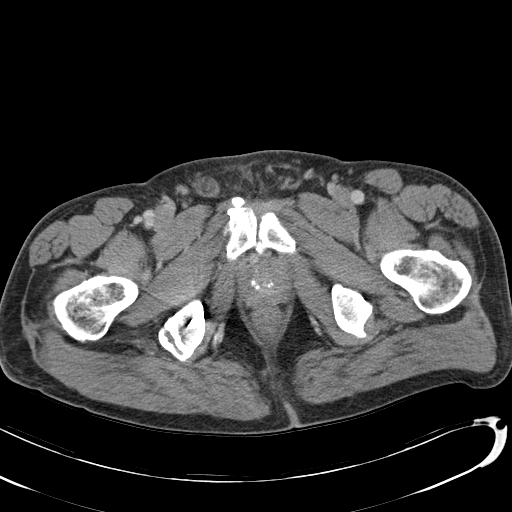
[im 24/100  soft-tissue]
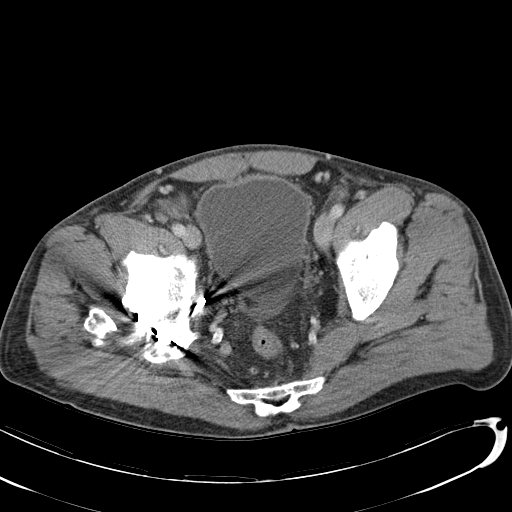
[im 30/100  soft-tissue]
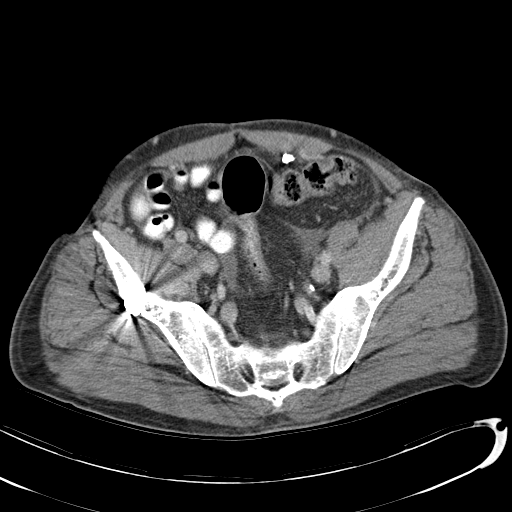
[im 35/100  soft-tissue]
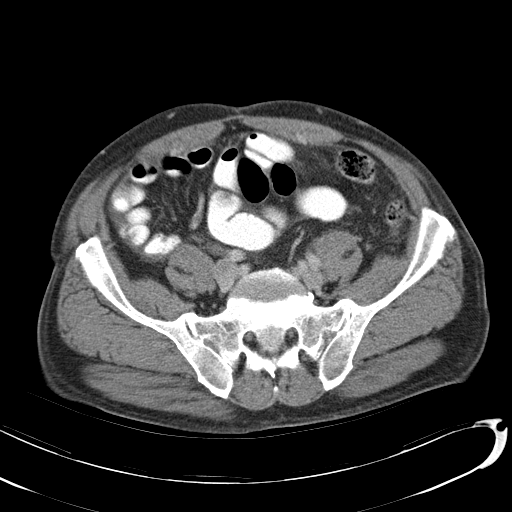
[im 41/100  soft-tissue]
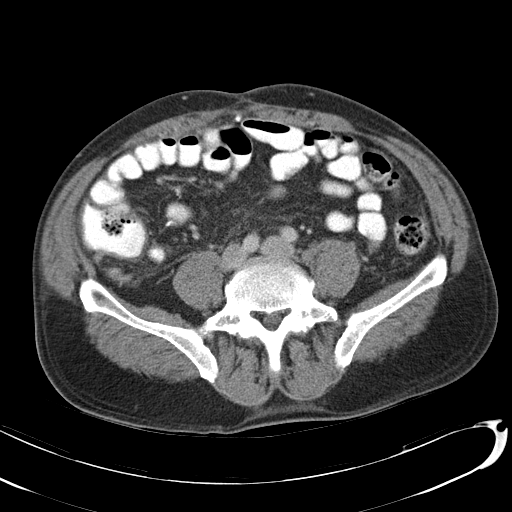
[im 53/100  soft-tissue]
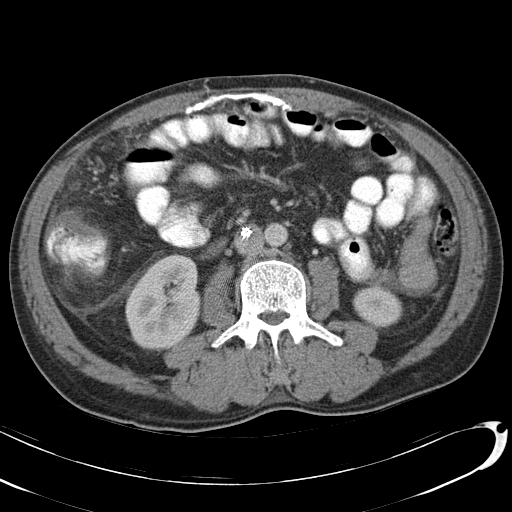
[im 59/100  soft-tissue]
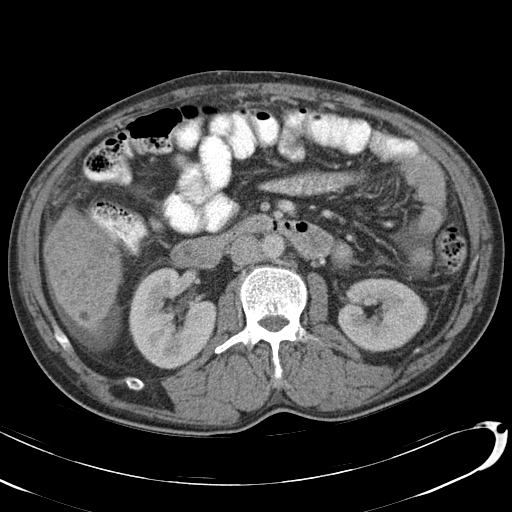
[im 65/100  soft-tissue]
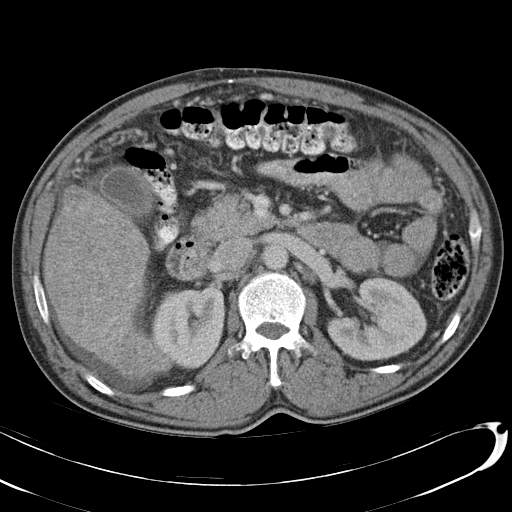
[im 65/100  bone]
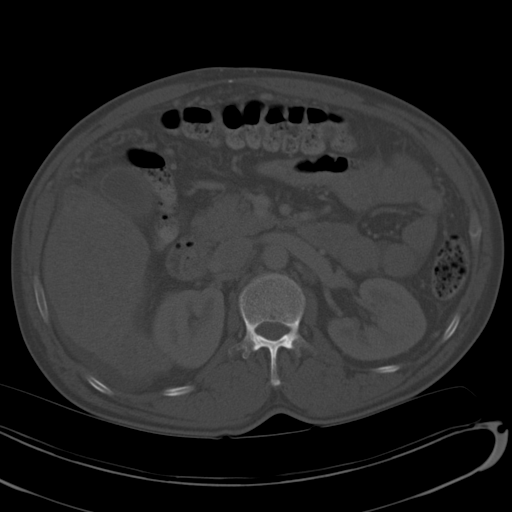
[im 70/100  soft-tissue]
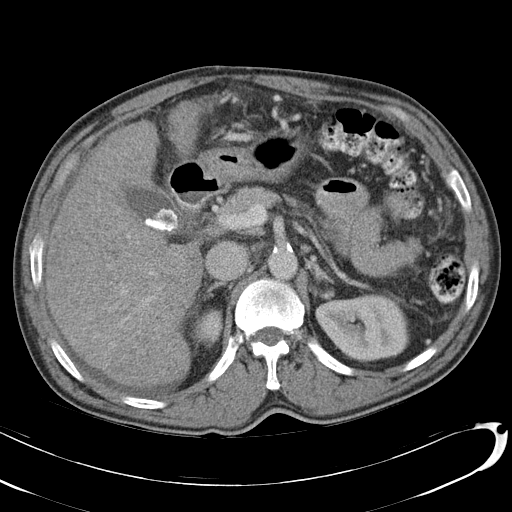
[im 76/100  soft-tissue]
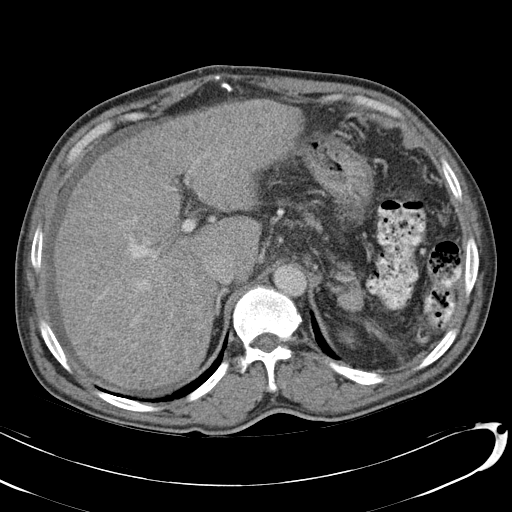
[im 88/100  soft-tissue]
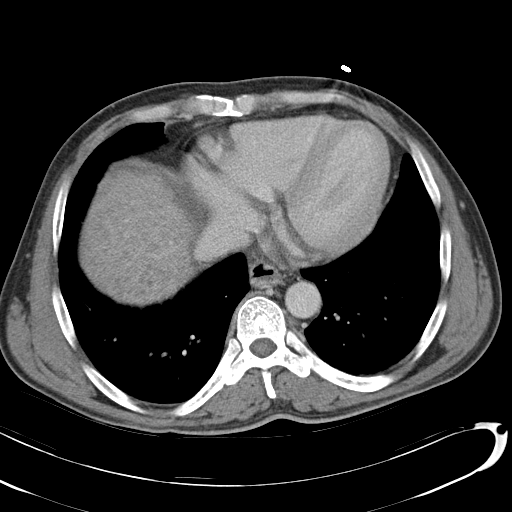
[im 94/100  soft-tissue]
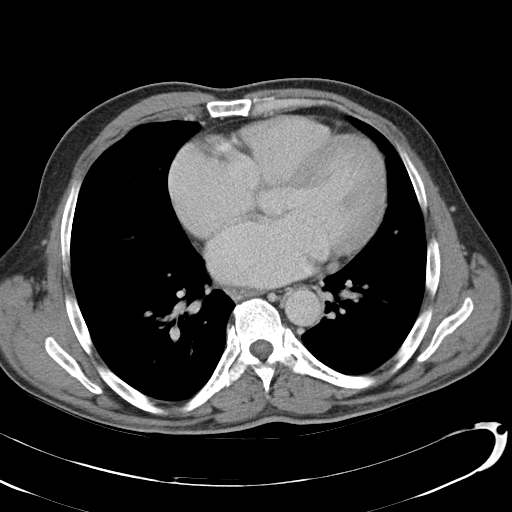

[Series 4: abd_pel_with 3.0 spo cor · coronal · 0.69mm/px · 3 of 90 slices shown]
[im 30/90  soft-tissue]
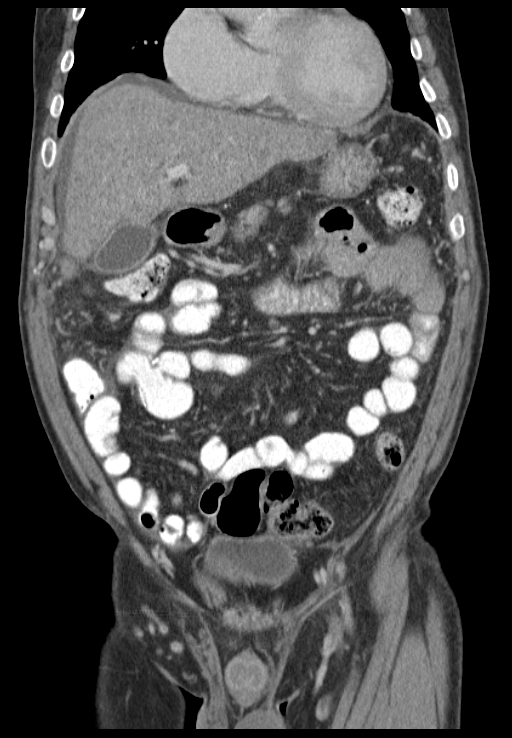
[im 40/90  soft-tissue]
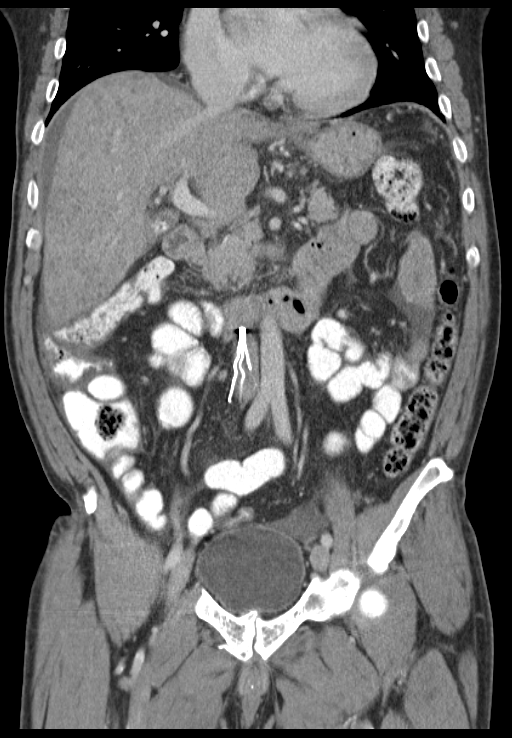
[im 50/90  soft-tissue]
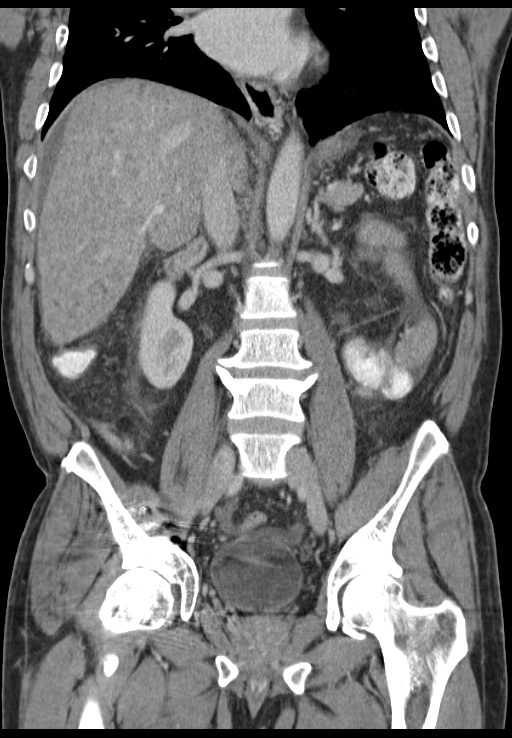

[16 of 46 positions shown; findings below may reference images not displayed]

FINDINGS: Minimal dependent atelectasis at lung bases.
Cholelithiasis.
Inhomogeneous liver with irregular slightly nodular contours
compatible with cirrhosis.
No definite focal hepatic mass.
8 mm cyst posterior right lobe liver image 20.
Portal vein and KHALIA JAVID patent.
Prior splenectomy with minimal soft tissue nodularity in high left
upper quadrant question splenosis.
Ascites identified upper abdomen with numerous collateral vessels.
Questionable enhancing vessels at wall of esophagus, question
esophageal varices. Pancreas, kidneys, and adrenal glands normal.
IVC filter.
Normal appendix.
Bladder and ureters unremarkable.
Prior ventral hernia repair and right pelvic reconstruction.
Large and small bowel loops grossly unremarkable.
No definite mass, adenopathy, hernia, or free air.
No acute osseous findings.
IMPRESSION: Prior splenectomy with question minimal splenosis left upper
quadrant.
Inhomogeneous and cirrhotic appearing liver with ascites and
multiple upper abdominal varices/collaterals.
Question esophageal varices.
Prior ventral hernia repair.
Cholelithiasis.

## 2012-01-14 ENCOUNTER — Telehealth: Payer: Self-pay | Admitting: Gastroenterology

## 2012-01-14 DIAGNOSIS — K769 Liver disease, unspecified: Secondary | ICD-10-CM

## 2012-01-14 DIAGNOSIS — K7460 Unspecified cirrhosis of liver: Secondary | ICD-10-CM

## 2012-01-14 DIAGNOSIS — F1010 Alcohol abuse, uncomplicated: Secondary | ICD-10-CM

## 2012-01-14 NOTE — Telephone Encounter (Signed)
Please let pt know, He is due for labs and needs MRI liver with contrast. I was hoping to schedule this at next OV but he continues to reschedule so we need to just go ahead now. Patient still needs to keep OV with RMR in 01/2012.  Labs and MRI order done.

## 2012-01-16 NOTE — Telephone Encounter (Signed)
Mailed letter to pt

## 2012-01-28 ENCOUNTER — Ambulatory Visit (INDEPENDENT_AMBULATORY_CARE_PROVIDER_SITE_OTHER): Payer: Medicaid Other | Admitting: Internal Medicine

## 2012-01-28 ENCOUNTER — Encounter: Payer: Self-pay | Admitting: Internal Medicine

## 2012-01-28 ENCOUNTER — Other Ambulatory Visit: Payer: Self-pay | Admitting: Internal Medicine

## 2012-01-28 VITALS — BP 126/72 | HR 63 | Temp 98.1°F | Ht 63.0 in | Wt 158.2 lb

## 2012-01-28 DIAGNOSIS — K219 Gastro-esophageal reflux disease without esophagitis: Secondary | ICD-10-CM

## 2012-01-28 DIAGNOSIS — K7460 Unspecified cirrhosis of liver: Secondary | ICD-10-CM

## 2012-01-28 DIAGNOSIS — R932 Abnormal findings on diagnostic imaging of liver and biliary tract: Secondary | ICD-10-CM

## 2012-01-28 LAB — CBC WITH DIFFERENTIAL/PLATELET
Basophils Relative: 1 % (ref 0–1)
Eosinophils Absolute: 0.3 10*3/uL (ref 0.0–0.7)
MCH: 30 pg (ref 26.0–34.0)
MCHC: 33.1 g/dL (ref 30.0–36.0)
Neutrophils Relative %: 40 % — ABNORMAL LOW (ref 43–77)
Platelets: 72 10*3/uL — ABNORMAL LOW (ref 150–400)

## 2012-01-28 NOTE — Patient Instructions (Addendum)
Resume Protonix  daily   Stop Drinking alcholol  Office visit in 3 months  Get MRI and labs as directed
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
## 2012-01-29 LAB — HEPATIC FUNCTION PANEL
Alkaline Phosphatase: 162 U/L — ABNORMAL HIGH (ref 39–117)
Indirect Bilirubin: 2.2 mg/dL — ABNORMAL HIGH (ref 0.0–0.9)
Total Bilirubin: 3.9 mg/dL — ABNORMAL HIGH (ref 0.3–1.2)
Total Protein: 8 g/dL (ref 6.0–8.3)

## 2012-01-29 LAB — PROTIME-INR: Prothrombin Time: 26.2 seconds — ABNORMAL HIGH (ref 11.6–15.2)

## 2012-01-29 LAB — BASIC METABOLIC PANEL
BUN: 4 mg/dL — ABNORMAL LOW (ref 6–23)
CO2: 28 mEq/L (ref 19–32)
Chloride: 100 mEq/L (ref 96–112)
Creat: 0.47 mg/dL — ABNORMAL LOW (ref 0.50–1.35)
Glucose, Bld: 91 mg/dL (ref 70–99)

## 2012-01-30 ENCOUNTER — Inpatient Hospital Stay (HOSPITAL_COMMUNITY): Admission: RE | Admit: 2012-01-30 | Payer: Self-pay | Source: Ambulatory Visit

## 2012-01-31 ENCOUNTER — Ambulatory Visit: Payer: Self-pay | Admitting: Internal Medicine

## 2012-01-31 ENCOUNTER — Ambulatory Visit (HOSPITAL_COMMUNITY)
Admission: RE | Admit: 2012-01-31 | Discharge: 2012-01-31 | Disposition: A | Discharge status: Home / Self Care | Payer: Self-pay | Source: Ambulatory Visit | Attending: Gastroenterology | Admitting: Gastroenterology

## 2012-01-31 DIAGNOSIS — K8020 Calculus of gallbladder without cholecystitis without obstruction: Secondary | ICD-10-CM | POA: Insufficient documentation

## 2012-01-31 DIAGNOSIS — R188 Other ascites: Secondary | ICD-10-CM | POA: Insufficient documentation

## 2012-01-31 DIAGNOSIS — K7689 Other specified diseases of liver: Secondary | ICD-10-CM | POA: Insufficient documentation

## 2012-01-31 DIAGNOSIS — R933 Abnormal findings on diagnostic imaging of other parts of digestive tract: Secondary | ICD-10-CM | POA: Insufficient documentation

## 2012-01-31 DIAGNOSIS — K7460 Unspecified cirrhosis of liver: Secondary | ICD-10-CM | POA: Insufficient documentation

## 2012-01-31 DIAGNOSIS — F1010 Alcohol abuse, uncomplicated: Secondary | ICD-10-CM | POA: Insufficient documentation

## 2012-01-31 DIAGNOSIS — K769 Liver disease, unspecified: Secondary | ICD-10-CM

## 2012-01-31 MED ORDER — GADOBENATE DIMEGLUMINE 529 MG/ML IV SOLN
15.0000 mL | Freq: Once | INTRAVENOUS | Status: AC | PRN
  Administered 2012-01-31: 15 mL via INTRAVENOUS

## 2012-02-03 NOTE — Progress Notes (Signed)
Quick Note:  No evidence of hepatoma. Right colon wall thickening likely related to liver disease.  AFP did not get done. See if it can be added on, if not patient needs to go back to lab. Keep ov in 3 months as per RMR recommendations. ______

## 2012-02-04 ENCOUNTER — Telehealth: Payer: Self-pay | Admitting: *Deleted

## 2012-02-04 ENCOUNTER — Other Ambulatory Visit: Payer: Self-pay | Admitting: Internal Medicine

## 2012-02-04 ENCOUNTER — Encounter: Payer: Self-pay | Admitting: Gastroenterology

## 2012-02-04 DIAGNOSIS — K7460 Unspecified cirrhosis of liver: Secondary | ICD-10-CM

## 2012-02-04 NOTE — Progress Notes (Signed)
Tried to call pt. Number was busy. 

## 2012-02-04 NOTE — Progress Notes (Signed)
Patient ID: Austin Ruiz, male   DOB: April 23, 1956, 56 y.o.   MRN: [id number]  Lab Results  Component Value Date   INR 2.31* 01/28/2012   INR 2.22* 11/06/2011   INR 2.01* 10/24/2011   Lab Results  Component Value Date   ALT 38 01/28/2012   AST 130* 01/28/2012   ALKPHOS 162* 01/28/2012   BILITOT 3.9* 01/28/2012   Lab Results  Component Value Date   WBC 4.7 01/28/2012   HGB 12.4* 01/28/2012   HCT 37.5* 01/28/2012   MCV 90.8 01/28/2012   PLT 72* 01/28/2012   Lab Results  Component Value Date   CREATININE 0.47* 01/28/2012   BUN 4* 01/28/2012   NA 134* 01/28/2012   K 4.1 01/28/2012   CL 100 01/28/2012   CO2 28 01/28/2012    Discussed with Dr. Jena Gauss. Patient has a DF of 69, not 8 as previously noted in result notes attached to 01/28/12 labs. Unfortunately patient continues to drink and has had numerous courses of prednisone. Previously unable to afford prednisolone.   Need to restart steroids. Call in RX for prednisolone 10mg  tabs, take four daily for four weeks, then drop one tablet every week until off. Quantity sufficient with no refills. If patient cannot afford prednisolone, then change to prednisone with same instructions.  Recheck INR, CMET in 3 weeks.

## 2012-02-04 NOTE — Telephone Encounter (Signed)
Please call Austin Ruiz with the results of Austin Ruiz blood work. Thank you.

## 2012-02-04 NOTE — Telephone Encounter (Signed)
Tried to call Mrs. Woodford back and number was busy

## 2012-02-05 ENCOUNTER — Other Ambulatory Visit: Payer: Self-pay

## 2012-02-05 DIAGNOSIS — K7460 Unspecified cirrhosis of liver: Secondary | ICD-10-CM

## 2012-02-05 NOTE — Telephone Encounter (Signed)
Tried to call- NA 

## 2012-02-05 NOTE — Progress Notes (Signed)
Tried to call- NA 

## 2012-02-06 NOTE — Progress Notes (Signed)
Tried to call- phone number was busy

## 2012-02-06 NOTE — Telephone Encounter (Signed)
Tried to call number was busy

## 2012-02-09 ENCOUNTER — Encounter: Payer: Self-pay | Admitting: Internal Medicine

## 2012-02-09 NOTE — Progress Notes (Signed)
Patient ID: Austin Ruiz, male   DOB: 05/28/1956, 56 y.o.   MRN: [id number] Reviewed records from [facility]. Patient admitted the air from [facility] in June of this year with extreme alcohol withdrawal. Ultrasound there demonstrated a cirrhotic liver. INR 2.3 peak bilirubin 8.8. Apparently prednisone he was taking was discontinued during his hospitalization hepatitis B surface antigen came back negative vitamin B12 702 hepatitis B core antibody total nonreactive

## 2012-02-10 NOTE — Progress Notes (Signed)
Called in the prednisone because the prednisolone cost to much.

## 2012-02-10 NOTE — Progress Notes (Signed)
Pt's wife is aware. Will call in Rx to [facility] in [place]

## 2012-02-10 NOTE — Telephone Encounter (Signed)
Austin Ruiz has spoken to pts wife.

## 2012-02-11 ENCOUNTER — Other Ambulatory Visit: Payer: Self-pay | Admitting: Gastroenterology

## 2012-02-11 ENCOUNTER — Other Ambulatory Visit: Payer: Self-pay

## 2012-02-11 ENCOUNTER — Ambulatory Visit (INDEPENDENT_AMBULATORY_CARE_PROVIDER_SITE_OTHER): Payer: Medicaid Other | Admitting: Internal Medicine

## 2012-02-11 DIAGNOSIS — K7460 Unspecified cirrhosis of liver: Secondary | ICD-10-CM

## 2012-02-11 LAB — IFOBT (OCCULT BLOOD): IFOBT: NEGATIVE

## 2012-02-13 NOTE — Progress Notes (Signed)
LMOM to call back

## 2012-02-14 DIAGNOSIS — R17 Unspecified jaundice: Secondary | ICD-10-CM

## 2012-02-14 LAB — CBC WITH DIFFERENTIAL/PLATELET
Albumin: 2.4
Ammonia: 39
Amylase: 48 units/L (ref 25–110)
Bilirubin, Direct: 1.7 mg/dL — AB (ref 0.01–0.4)
Lipase: 51 units/L (ref 0–53)
MCV: 96 fL
WBC: 5

## 2012-02-17 ENCOUNTER — Telehealth: Payer: Self-pay

## 2012-02-17 NOTE — Progress Notes (Signed)
Faxed to PCP

## 2012-02-17 NOTE — Telephone Encounter (Signed)
FYI-pts wife called- she wanted to let RMR know that pt was admitted to [facility] Friday with jaundice, dizziness, headache, chest pain and dehydration. They also have him in the detox program.

## 2012-02-21 LAB — CBC WITH DIFFERENTIAL/PLATELET
ALT: 27 U/L (ref 10–40)
AST: 63 U/L
Albumin: 2.1
Alkaline Phosphatase: 159 U/L
Total Bilirubin: 4.6 mg/dL

## 2012-02-26 ENCOUNTER — Encounter: Payer: Self-pay | Admitting: Gastroenterology

## 2012-02-26 ENCOUNTER — Ambulatory Visit (INDEPENDENT_AMBULATORY_CARE_PROVIDER_SITE_OTHER): Payer: Medicaid Other | Admitting: Gastroenterology

## 2012-02-26 VITALS — BP 136/68 | HR 62 | Temp 98.0°F | Ht 63.0 in | Wt 171.8 lb

## 2012-02-26 DIAGNOSIS — K7010 Alcoholic hepatitis without ascites: Secondary | ICD-10-CM

## 2012-02-26 DIAGNOSIS — E7220 Disorder of urea cycle metabolism, unspecified: Secondary | ICD-10-CM

## 2012-02-26 DIAGNOSIS — K7460 Unspecified cirrhosis of liver: Secondary | ICD-10-CM

## 2012-02-26 DIAGNOSIS — R601 Generalized edema: Secondary | ICD-10-CM

## 2012-02-26 DIAGNOSIS — R7989 Other specified abnormal findings of blood chemistry: Secondary | ICD-10-CM

## 2012-02-26 DIAGNOSIS — R609 Edema, unspecified: Secondary | ICD-10-CM

## 2012-02-26 DIAGNOSIS — D649 Anemia, unspecified: Secondary | ICD-10-CM

## 2012-02-26 DIAGNOSIS — R600 Localized edema: Secondary | ICD-10-CM

## 2012-02-26 DIAGNOSIS — K7030 Alcoholic cirrhosis of liver without ascites: Secondary | ICD-10-CM

## 2012-02-26 MED ORDER — SPIRONOLACTONE 100 MG PO TABS: 100.0000 mg | ORAL_TABLET | Freq: Every day | ORAL | Status: DC

## 2012-02-26 MED ORDER — LACTULOSE 10 GM/15ML PO SOLN: 20.0000 g | Freq: Three times a day (TID) | ORAL | Status: DC

## 2012-02-26 NOTE — Patient Instructions (Addendum)
Continue lactulose, 2 tablespoons 3 times a day as needed to have at least 3 bowel movements daily. New prescription sent to [facility]. Stop potassium chloride when you begin spironolactone. Start spironolactone 100 mg daily. New prescription sent to [facility]. Please have lab work done in one week, we will followup on her potassium level, liver, kidneys at that time. Office visit in 8 weeks with Dr. Jena Gauss.

## 2012-02-26 NOTE — Assessment & Plan Note (Addendum)
ETOH cirrhosis with chronic ETOH hepatitis. Failed multiple inpatient rehab attempts. Goes to AA but drinks same day. Drinking etoh and hard liquor daily. On prednisone taper. Long discussion with patient and wife regarding need to eliminate etoh completely. He will die at relatively young age if he cannot stop drinking. Patient voices understanding. Currently having increased lower ext edema in setting of not being on aldactone. Will resume today. Will hold potassium. Recheck CMET/INR in one week. Monitor weight at home. If no drop in weight and/or increase in weight by five pounds, his wife will call us. He will continue prednisone taper. Recent elevation of ammonia while inpatient with etoh withdrawals. Unclear if also had hepatic encephalopathy. Recommend he stay on lactulose for now. New RX provided. OV in 8 weeks with Dr. Jena Gauss.

## 2012-02-26 NOTE — Progress Notes (Signed)
Primary Care Physician: Estanislado Pandy, MD  Primary Gastroenterologist:  Roetta Sessions, MD   Chief Complaint  Patient presents with  . Follow-up    HPI: Austin Ruiz is a 56 y.o. male here for followup of recent hospitalization. He has a history of cirrhosis/alcoholic hepatitis. Last seen here on 01/28/2012 by Dr. Jena Gauss. History of noncompliance. Multiple failed attempts for alcohol cessation, including multiple inpatient facilities. Since his last office visit, he did have an MR of liver as outlined below. No suspicious lesions for hepatoma. Labs few weeks ago consistent with ongoing alcoholic hepatitis, DF greater than 60, placed back on prednisone (cannot afford prednisolone). AFP normal at 3.8. He was started back on pantoprazole.   Weight up 13 pounds since 01/28/12.  Admitted to [facility] 02/14/12 to 02/22/12 with etoh withdrawal, jaundice, hypokalemia. Per d/c med list: lasix 20mg  daily, KCL daily. No aldactone. Prednisone taper. On admission his total bilirubin was 5.0, direct bilirubin 1.7, AST 96, ALT 26, alkaline phosphatase 175, ammonia level XXXIX, albumin 2.4, amylase 48, lipase 51, white blood cell count 5000, hemoglobin 11.7, MCV 96, platelets 70,000, serum alcohol level 299. On August 30 his creatinine was 0.42, calcium 7.8, bilirubin 4.6, alkaline phosphatase 159, AST 63, ALT 27, albumin 2.1, potassium 3.8, sodium 136. Ammonia level on August 28 was 70. At time of discharge was 52.  One 40 ounce beer every day. Today had a mix drink. No tremor. Went to AA last night. Denies abdominal pain.  BM 3 times per day. No melena, brbpr, hematemesis. C/O lower extremity edema. Worse the last three days. No other complaints. States he is motivated to quit drinking.   Current Outpatient Prescriptions  Medication Sig Dispense Refill  . citalopram (CELEXA) 20 MG tablet Take 20 mg by mouth daily.      . furosemide (LASIX) 20 MG tablet Take 20 mg by mouth daily.      Marland Kitchen lactulose (CHRONULAC) 10  GM/15ML solution Take 30 mLs (20 g total) by mouth 3 (three) times daily.  1892 mL  2  . Multiple Vitamin (MULTIVITAMIN) capsule Take 1 capsule by mouth daily.        Marland Kitchen oxazepam (SERAX) 10 MG capsule Take 10 mg by mouth every 6 (six) hours.       . pantoprazole (PROTONIX) 40 MG tablet Take 1 tablet (40 mg total) by mouth daily.  90 tablet  3  . predniSONE (DELTASONE) 10 MG tablet Take 40mg  daily X 28 days. Then 30mg  daily X 5 days. Then 20mg  daily X 5 days. Then 10mg  daily X 5 days.  142 tablet  0  . vitamin B-12 (CYANOCOBALAMIN) 1000 MCG tablet Take 1,000 mcg by mouth daily.        .           Allergies as of 02/26/2012  . (No Known Allergies)    ROS:  General: Negative for anorexia, weight loss, fever, chills, fatigue, weakness. ENT: Negative for hoarseness, difficulty swallowing , nasal congestion. CV: Negative for chest pain, angina, palpitations, dyspnea on exertion. See hpi for peripheral edema.  Respiratory: Negative for dyspnea at rest, dyspnea on exertion, cough, sputum, wheezing.  GI: See history of present illness. GU:  Negative for dysuria, hematuria, urinary incontinence, urinary frequency, nocturnal urination.  Endo: Negative for unusual weight change. See hpi.   Physical Examination:   BP 136/68  Pulse 62  Temp 98 F (36.7 C) (Temporal)  Ht 5\' 3"  (1.6 m)  Wt 171 lb 12.8 oz (77.928  kg)  BMI 30.43 kg/m2  General: Well-nourished, well-developed in no acute distress. Accompanied by wife.  Eyes: No icterus. Mouth: Oropharyngeal mucosa moist and pink , no lesions erythema or exudate. Lungs: Clear to auscultation bilaterally.  Heart: Regular rate and rhythm, no murmurs rubs or gallops.  Abdomen: Bowel sounds are normal, nontender, nondistended, no hepatosplenomegaly or masses, no abdominal bruits or hernia , no rebound or guarding.   Extremities: 2+pitting edema to knees bilaterally. No clubbing or deformities. Neuro: Alert and oriented x 4   Skin: Warm and dry, no  jaundice.   Psych: Alert and cooperative, normal mood and affect.  Labs:  See above.  Imaging Studies: Mr Liver W Wo Contrast  February 05, 2012  *RADIOLOGY REPORT*  Clinical Data:  Cirrhosis with multiple small liver lesions on CT. Hepatocellular carcinoma surveillance.  MRI ABDOMEN WITHOUT AND WITH CONTRAST  Technique:  Multiplanar multisequence MR imaging of the abdomen was performed both before and after the administration of intravenous contrast.  Contrast: 15mL MULTIHANCE GADOBENATE DIMEGLUMINE 529 MG/ML IV SOLN  Comparison:  CT 08/11/2011 from [facility] and 10/09/2010 from [facility].  Findings:  Again demonstrated are changes of advanced cirrhosis with diffuse contour nodularity of the liver.  There is a 6 mm cyst inferiorly in the right hepatic lobe which is unchanged from the original CT.  No suspicious lesions are seen on the T2-weighted images.  The arterial phase postcontrast images demonstrate no avidly enhancing lesions.  There is slightly increased nodular enhancement in the dome of the right hepatic lobe (image 31) and inferiorly in the right hepatic lobe (image 82).  These areas become isointense on subsequent delayed images.  The delayed phase images demonstrate numerous regenerative nodules throughout the liver.  A probable siderotic nodule posteriorly in the right hepatic lobe is unchanged from the original study.  The main portal vein is patent but small.  There is probable cavernous transformation of the portal vein within the right hepatic lobe.  The IVC and hepatic veins are patent. An IVC filter is in place.  There is a moderate amount of ascites.  No peritoneal nodularity is identified.  Multiple varices are again noted within the anterior abdominal wall and omentum. Bilateral gynecomastia is noted.  The spleen is surgically absent.  There is probable stable regenerated splenic tissue in the subphrenic space.  The pancreas, adrenal glands and kidneys appear normal.   Multiple gallstones are again noted.  There is no biliary dilatation. New nonspecific mild right colonic wall thickening noted on CT appears unchanged.  IMPRESSION:  1.  Advanced cirrhosis with multiple regenerative/siderotic nodules, not grossly changed from original CT of 16 months ago (now available). No focally suspicious lesions identified.  Continued MRI surveillance should be considered. 2.  Ascites, multiple varices and gynecomastia again noted.  The right colonic wall thickening is likely related to the liver disease and is unchanged. 3.  Cholelithiasis.  Original Report Authenticated By: Gerrianne Scale, M.D.

## 2012-02-27 NOTE — Progress Notes (Signed)
Faxed to PCP

## 2012-03-02 ENCOUNTER — Telehealth: Payer: Self-pay

## 2012-03-02 DIAGNOSIS — K7010 Alcoholic hepatitis without ascites: Secondary | ICD-10-CM

## 2012-03-02 NOTE — Telephone Encounter (Signed)
Pt came by to pick up some papers and he wanted to check his weight since he was here. His weight today was 182.0 that is up 11 lb from 02/26/12. He does not have any trouble breathing. Please advise.

## 2012-03-03 ENCOUNTER — Ambulatory Visit: Payer: Self-pay | Admitting: Internal Medicine

## 2012-03-03 NOTE — Telephone Encounter (Signed)
Pt's wife called back and he is taking the medication the right way. I do not see any orders for him to have blood work done. What does he need to have done?

## 2012-03-03 NOTE — Telephone Encounter (Signed)
Left message with Max for his wife to call me back

## 2012-03-03 NOTE — Telephone Encounter (Signed)
Please clarify patient restarted aldactone. Should be on aldactone 100mg  daily and lasix 20mg  daily. Should have STOPPED potassium.  Due for labs tomorrow. Based on above and labs, we may can adjust his fluid pills.

## 2012-03-04 NOTE — Telephone Encounter (Signed)
CMET and INR 

## 2012-03-07 LAB — PROTIME-INR
INR: 2.12 — ABNORMAL HIGH
Prothrombin Time: 24.5 s — ABNORMAL HIGH (ref 11.6–15.2)

## 2012-03-07 LAB — COMPREHENSIVE METABOLIC PANEL
ALT: 35 U/L (ref 0–53)
Albumin: 2.4 g/dL — ABNORMAL LOW (ref 3.5–5.2)
CO2: 25 mEq/L (ref 19–32)
Chloride: 100 mEq/L (ref 96–112)
Glucose, Bld: 178 mg/dL — ABNORMAL HIGH (ref 70–99)
Potassium: 4.7 mEq/L (ref 3.5–5.3)
Sodium: 131 mEq/L — ABNORMAL LOW (ref 135–145)
Total Protein: 6.6 g/dL (ref 6.0–8.3)

## 2012-03-09 NOTE — Telephone Encounter (Signed)
Quick Note:  DF still 62. Patient on prednisone taper. Still drinking.  Glucose is up ?secondary to steroids.  Can increase his aldactone to 100mg  am and 50mg  in pm. Continue lasix 20mg  daily. NO POTASSIUM SUPPLEMENTS! Met-7 in one week. Continue to keep daily weights. If weight does not drop with diuretics changes, he should let us know. ______

## 2012-03-19 ENCOUNTER — Other Ambulatory Visit: Payer: Self-pay | Admitting: Gastroenterology

## 2012-03-19 ENCOUNTER — Other Ambulatory Visit: Payer: Self-pay

## 2012-03-19 DIAGNOSIS — K7010 Alcoholic hepatitis without ascites: Secondary | ICD-10-CM

## 2012-03-23 ENCOUNTER — Telehealth: Payer: Self-pay

## 2012-03-23 NOTE — Telephone Encounter (Signed)
pts wife called- since LSL increased his aldactone to 100mg  qam and 50mg  qpm pt needs refill sent to pharmacy.

## 2012-03-24 NOTE — Progress Notes (Signed)
Patient ID: Austin Ruiz, male   DOB: 04-24-1956, 56 y.o.   MRN: [id number] Pt came by today to check his weight and it was 171 lb. The last time he came by his weight was 182.

## 2012-03-25 ENCOUNTER — Other Ambulatory Visit: Payer: Self-pay | Admitting: Gastroenterology

## 2012-03-25 DIAGNOSIS — D649 Anemia, unspecified: Secondary | ICD-10-CM

## 2012-03-25 DIAGNOSIS — K7030 Alcoholic cirrhosis of liver without ascites: Secondary | ICD-10-CM

## 2012-03-25 DIAGNOSIS — K7460 Unspecified cirrhosis of liver: Secondary | ICD-10-CM

## 2012-03-25 LAB — BASIC METABOLIC PANEL
CO2: 27 mEq/L (ref 19–32)
Calcium: 7.5 mg/dL — ABNORMAL LOW (ref 8.4–10.5)
Creat: 0.56 mg/dL (ref 0.50–1.35)
Glucose, Bld: 92 mg/dL (ref 70–99)
Sodium: 131 mEq/L — ABNORMAL LOW (ref 135–145)

## 2012-03-25 MED ORDER — SPIRONOLACTONE 100 MG PO TABS: ORAL_TABLET | ORAL | Status: DC

## 2012-03-25 NOTE — Progress Notes (Signed)
Patient ID: Austin Ruiz, male   DOB: 09-13-1955, 56 y.o.   MRN: [id number]   Noted. Patient on aldactone 100mg  am and 50mg  pm. Met-7 stable. Will send in rx to continue current dose.

## 2012-03-26 NOTE — Progress Notes (Signed)
Quick Note:  Glucose better. Other parameters stable with increase in aldactone. ______

## 2012-03-31 ENCOUNTER — Encounter: Payer: Self-pay | Admitting: *Deleted

## 2012-04-01 ENCOUNTER — Telehealth: Payer: Self-pay

## 2012-04-01 NOTE — Telephone Encounter (Signed)
Yes. Cut back on lactulose (can hold altogether if needed while on antibiotics) in order to have no more than 2-3 stools daily.  If diarrhea persists, he should let us know.

## 2012-04-01 NOTE — Telephone Encounter (Signed)
pts wife Delores called- pt is on second antibiotic for bronchitis. He is taking ceftin 250mg  bid x 10days. Pt is not having a lot of watery diarrhea but is having several soft, somewhat loose stools everyday because he is also taking the lactulose.  Pt has 7 days left to take abx. They want to know if they should cut back on the dosing of lactulose until he finishes the abx. Please advise.

## 2012-04-01 NOTE — Telephone Encounter (Signed)
Pts wife is aware 

## 2012-04-20 ENCOUNTER — Telehealth: Payer: Self-pay | Admitting: Internal Medicine

## 2012-04-20 NOTE — Telephone Encounter (Signed)
Patients wife has questions about the rest of the family needs to be tested for same thing that Austin Ruiz has please advise

## 2012-04-22 NOTE — Telephone Encounter (Signed)
Spoke with pts wife and answered her questions.

## 2012-04-23 ENCOUNTER — Other Ambulatory Visit (HOSPITAL_COMMUNITY): Payer: Self-pay

## 2012-04-28 ENCOUNTER — Encounter (HOSPITAL_COMMUNITY): Payer: Self-pay | Attending: Oncology | Admitting: Oncology

## 2012-04-28 ENCOUNTER — Encounter (HOSPITAL_BASED_OUTPATIENT_CLINIC_OR_DEPARTMENT_OTHER): Payer: Self-pay

## 2012-04-28 VITALS — BP 150/72 | HR 72 | Temp 97.7°F | Resp 18 | Wt 160.7 lb

## 2012-04-28 DIAGNOSIS — D649 Anemia, unspecified: Secondary | ICD-10-CM

## 2012-04-28 DIAGNOSIS — I81 Portal vein thrombosis: Secondary | ICD-10-CM

## 2012-04-28 DIAGNOSIS — F1010 Alcohol abuse, uncomplicated: Secondary | ICD-10-CM

## 2012-04-28 DIAGNOSIS — D696 Thrombocytopenia, unspecified: Secondary | ICD-10-CM | POA: Insufficient documentation

## 2012-04-28 DIAGNOSIS — K7030 Alcoholic cirrhosis of liver without ascites: Secondary | ICD-10-CM

## 2012-04-28 DIAGNOSIS — R1011 Right upper quadrant pain: Secondary | ICD-10-CM

## 2012-04-28 DIAGNOSIS — K7460 Unspecified cirrhosis of liver: Secondary | ICD-10-CM

## 2012-04-28 DIAGNOSIS — K7010 Alcoholic hepatitis without ascites: Secondary | ICD-10-CM

## 2012-04-28 DIAGNOSIS — F1020 Alcohol dependence, uncomplicated: Secondary | ICD-10-CM

## 2012-04-28 DIAGNOSIS — D6959 Other secondary thrombocytopenia: Secondary | ICD-10-CM

## 2012-04-28 LAB — CBC
HCT: 35.3 % — ABNORMAL LOW (ref 39.0–52.0)
MCHC: 35.7 g/dL (ref 30.0–36.0)
RDW: 15.8 % — ABNORMAL HIGH (ref 11.5–15.5)

## 2012-04-28 LAB — DIFFERENTIAL
Basophils Absolute: 0.1 10*3/uL (ref 0.0–0.1)
Basophils Relative: 2 % — ABNORMAL HIGH (ref 0–1)
Eosinophils Absolute: 0.2 10*3/uL (ref 0.0–0.7)
Monocytes Absolute: 0.7 10*3/uL (ref 0.1–1.0)
Neutro Abs: 2.1 10*3/uL (ref 1.7–7.7)

## 2012-04-28 LAB — COMPREHENSIVE METABOLIC PANEL
ALT: 19 U/L (ref 0–53)
Alkaline Phosphatase: 227 U/L — ABNORMAL HIGH (ref 39–117)
BUN: 4 mg/dL — ABNORMAL LOW (ref 6–23)
CO2: 25 mEq/L (ref 19–32)
Chloride: 97 mEq/L (ref 96–112)
GFR calc Af Amer: >90 mL/min (ref >90)
GFR calc non Af Amer: >90 mL/min (ref >90)
Glucose, Bld: 152 mg/dL — ABNORMAL HIGH (ref 70–99)

## 2012-04-28 LAB — PROTIME-INR: Prothrombin Time: 22.1 seconds — ABNORMAL HIGH (ref 11.6–15.2)

## 2012-04-28 NOTE — Patient Instructions (Addendum)
[facility]  Discharge Instructions  RECOMMENDATIONS MADE BY THE CONSULTANT AND ANY TEST RESULTS WILL BE SENT TO YOUR REFERRING DOCTOR.   We will see you as needed on an interim basis. Either you or your physician may call to schedule an appointment if needed.   I acknowledge that I have been informed and understand all the instructions given to me and received a copy. I do not have any more questions at this time, but understand that I may call the Specialty Clinic at [facility] at [phone] during business hours should I have any further questions or need assistance in obtaining follow-up care.    __________________________________________  _____________  __________ Signature of Patient or Authorized Representative            Date                   Time    __________________________________________ Nurse's Signature

## 2012-04-28 NOTE — Progress Notes (Signed)
Labs drawn today for cbc/diff,cmp,pt,afp

## 2012-04-28 NOTE — Progress Notes (Signed)
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 He is accompanied by his wife today. They've been together many years. They have a teenage child. Unfortunately since I have seen the patient he was placed in [facility]. He was committed to this facility. As soon as he got out he started drinking again. His eyes remained jaundiced. His platelets are no worse than they have been. Hemoglobin and white count are about the same. LFTs remain elevated.  He has no intention of cessation of drinking. I will release her from this clinic. I've encouraged him to seek help and stop drinking once again.

## 2012-05-15 ENCOUNTER — Encounter: Payer: Self-pay | Admitting: Internal Medicine

## 2012-06-05 ENCOUNTER — Encounter: Payer: Self-pay | Admitting: Internal Medicine

## 2012-06-05 ENCOUNTER — Ambulatory Visit (INDEPENDENT_AMBULATORY_CARE_PROVIDER_SITE_OTHER): Payer: Medicaid Other | Admitting: Internal Medicine

## 2012-06-05 VITALS — BP 151/80 | HR 68 | Temp 98.2°F | Ht 63.0 in | Wt 167.2 lb

## 2012-06-05 DIAGNOSIS — K7460 Unspecified cirrhosis of liver: Secondary | ICD-10-CM
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
## 2012-06-05 NOTE — Patient Instructions (Addendum)
Follow-up with Dr. Neita Carp  CBC. INR, LFTs, BMet now  MRI of liver in February 2014  Office visit here in 3 months  STOP DRINKING ALCOHOL

## 2012-06-08 ENCOUNTER — Telehealth: Payer: Self-pay

## 2012-06-08 NOTE — Telephone Encounter (Signed)
I called Lubertha Basque to find out about pt's [facility] benefits. ( Dr. Jena Gauss ordered labs on Friday and I could not send a paper over for the lab, was unsure of his benefits. Per Kathie Rhodes, his benefits have lapsed and looks like he hasn't reapplied.

## 2012-06-11 LAB — CBC WITH DIFFERENTIAL/PLATELET
Basophils Absolute: 0.1 10*3/uL (ref 0.0–0.1)
Basophils Relative: 2 % — ABNORMAL HIGH (ref 0–1)
HCT: 37.4 % — ABNORMAL LOW (ref 39.0–52.0)
Hemoglobin: 13.1 g/dL (ref 13.0–17.0)
Lymphocytes Relative: 31 % (ref 12–46)
MCHC: 35 g/dL (ref 30.0–36.0)
Monocytes Absolute: 1.2 10*3/uL — ABNORMAL HIGH (ref 0.1–1.0)
Neutro Abs: 2.2 10*3/uL (ref 1.7–7.7)
Neutrophils Relative %: 41 % — ABNORMAL LOW (ref 43–77)
RDW: 16.2 % — ABNORMAL HIGH (ref 11.5–15.5)
WBC: 5.5 10*3/uL (ref 4.0–10.5)

## 2012-06-11 LAB — BASIC METABOLIC PANEL
CO2: 30 mEq/L (ref 19–32)
Chloride: 100 mEq/L (ref 96–112)
Potassium: 4.3 mEq/L (ref 3.5–5.3)
Sodium: 137 mEq/L (ref 135–145)

## 2012-06-11 LAB — HEPATIC FUNCTION PANEL
Albumin: 2.5 g/dL — ABNORMAL LOW (ref 3.5–5.2)
Total Bilirubin: 5.3 mg/dL — ABNORMAL HIGH (ref 0.3–1.2)
Total Protein: 7.9 g/dL (ref 6.0–8.3)

## 2012-06-11 LAB — PROTIME-INR
INR: 2.08 — ABNORMAL HIGH (ref <1.50)
Prothrombin Time: 22.7 seconds — ABNORMAL HIGH (ref 11.6–15.2)

## 2012-07-13 ENCOUNTER — Other Ambulatory Visit: Payer: Self-pay

## 2012-07-13 DIAGNOSIS — K7460 Unspecified cirrhosis of liver: Secondary | ICD-10-CM

## 2012-07-29 ENCOUNTER — Telehealth: Payer: Self-pay | Admitting: Internal Medicine

## 2012-07-29 NOTE — Telephone Encounter (Signed)
Gave her pt results from blood work.

## 2012-07-29 NOTE — Telephone Encounter (Signed)
Pt's wife was returning a call from yesterday, not sure who it was. She can be reached on her cell after 11am [phone]

## 2012-07-30 ENCOUNTER — Other Ambulatory Visit: Payer: Self-pay | Admitting: Internal Medicine

## 2012-07-30 ENCOUNTER — Telehealth: Payer: Self-pay | Admitting: Internal Medicine

## 2012-07-30 DIAGNOSIS — Z8719 Personal history of other diseases of the digestive system: Secondary | ICD-10-CM

## 2012-07-30 NOTE — Telephone Encounter (Signed)
Patient is scheduled for Thurs Feb 13 at 9:00 am and he is aware

## 2012-07-30 NOTE — Telephone Encounter (Signed)
Pt is aware of OV on 09/04/12 and needs hepatic ultrasound prior to OV.

## 2012-08-06 ENCOUNTER — Ambulatory Visit (HOSPITAL_COMMUNITY): Payer: Self-pay

## 2012-08-11 ENCOUNTER — Other Ambulatory Visit (HOSPITAL_COMMUNITY): Payer: Self-pay

## 2012-08-11 ENCOUNTER — Ambulatory Visit (HOSPITAL_COMMUNITY)
Admission: RE | Admit: 2012-08-11 | Discharge: 2012-08-11 | Disposition: A | Discharge status: Home / Self Care | Payer: Self-pay | Source: Ambulatory Visit | Attending: Internal Medicine | Admitting: Internal Medicine

## 2012-08-11 DIAGNOSIS — K7460 Unspecified cirrhosis of liver: Secondary | ICD-10-CM | POA: Insufficient documentation

## 2012-08-11 DIAGNOSIS — K8020 Calculus of gallbladder without cholecystitis without obstruction: Secondary | ICD-10-CM | POA: Insufficient documentation

## 2012-08-11 DIAGNOSIS — Z8719 Personal history of other diseases of the digestive system: Secondary | ICD-10-CM

## 2012-09-04 ENCOUNTER — Ambulatory Visit: Payer: Medicaid Other | Admitting: Internal Medicine

## 2012-09-29 ENCOUNTER — Encounter: Payer: Self-pay | Admitting: Internal Medicine

## 2012-09-29 ENCOUNTER — Ambulatory Visit (INDEPENDENT_AMBULATORY_CARE_PROVIDER_SITE_OTHER): Payer: Medicaid Other | Admitting: Internal Medicine

## 2012-09-29 VITALS — BP 155/73 | HR 69 | Temp 97.4°F | Ht 63.0 in | Wt 179.0 lb

## 2012-09-29 DIAGNOSIS — K7460 Unspecified cirrhosis of liver: Secondary | ICD-10-CM

## 2012-09-29 NOTE — Progress Notes (Signed)
REMINDER APPT MADE 

## 2012-09-29 NOTE — Patient Instructions (Addendum)
Twin Rx - to vaccinate against hepatitis A and B  Continue to abstain from alcohol use  2 g sodium diet  Continue Aldactone 100 mg daily  Continue Lasix 40 mg daily  If you have not had a recent MRI at [facility], we will obtain that x-ray here.  Office visit here in 3 months
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
## 2012-09-30 ENCOUNTER — Telehealth: Payer: Self-pay | Admitting: Internal Medicine

## 2012-09-30 NOTE — Telephone Encounter (Signed)
Pts wife is requesting information about liver transplants. I have mailed some information to them.

## 2012-09-30 NOTE — Telephone Encounter (Signed)
Pt's wife called to confirm that patient will be at [facility] on Friday at 0730 and their daughter will be taking him. Then call was transferred to [facility]

## 2012-10-02 ENCOUNTER — Telehealth: Payer: Self-pay

## 2012-10-02 ENCOUNTER — Ambulatory Visit (HOSPITAL_COMMUNITY)
Admission: RE | Admit: 2012-10-02 | Discharge: 2012-10-02 | Disposition: A | Discharge status: Home / Self Care | Payer: Medicaid Other | Source: Ambulatory Visit | Attending: Internal Medicine | Admitting: Internal Medicine

## 2012-10-02 ENCOUNTER — Emergency Department (HOSPITAL_COMMUNITY)
Admission: EM | Admit: 2012-10-02 | Discharge: 2012-10-02 | Disposition: A | Discharge status: Home / Self Care | Payer: Medicaid Other | Attending: Emergency Medicine | Admitting: Emergency Medicine

## 2012-10-02 ENCOUNTER — Encounter (HOSPITAL_COMMUNITY): Payer: Self-pay | Admitting: *Deleted

## 2012-10-02 DIAGNOSIS — F1020 Alcohol dependence, uncomplicated: Secondary | ICD-10-CM | POA: Insufficient documentation

## 2012-10-02 DIAGNOSIS — Z79899 Other long term (current) drug therapy: Secondary | ICD-10-CM | POA: Insufficient documentation

## 2012-10-02 DIAGNOSIS — R188 Other ascites: Secondary | ICD-10-CM | POA: Insufficient documentation

## 2012-10-02 DIAGNOSIS — Z711 Person with feared health complaint in whom no diagnosis is made: Secondary | ICD-10-CM | POA: Insufficient documentation

## 2012-10-02 DIAGNOSIS — K7460 Unspecified cirrhosis of liver: Secondary | ICD-10-CM

## 2012-10-02 DIAGNOSIS — Z860100 Personal history of colon polyps, unspecified: Secondary | ICD-10-CM | POA: Insufficient documentation

## 2012-10-02 DIAGNOSIS — K7030 Alcoholic cirrhosis of liver without ascites: Secondary | ICD-10-CM | POA: Insufficient documentation

## 2012-10-02 DIAGNOSIS — Z8719 Personal history of other diseases of the digestive system: Secondary | ICD-10-CM | POA: Insufficient documentation

## 2012-10-02 DIAGNOSIS — Z86718 Personal history of other venous thrombosis and embolism: Secondary | ICD-10-CM | POA: Insufficient documentation

## 2012-10-02 DIAGNOSIS — Z8679 Personal history of other diseases of the circulatory system: Secondary | ICD-10-CM | POA: Insufficient documentation

## 2012-10-02 LAB — CBC WITH DIFFERENTIAL/PLATELET
Eosinophils Absolute: 0.5 10*3/uL (ref 0.0–0.7)
Eosinophils Relative: 10 % — ABNORMAL HIGH (ref 0–5)
HCT: 32.3 % — ABNORMAL LOW (ref 39.0–52.0)
Hemoglobin: 11.5 g/dL — ABNORMAL LOW (ref 13.0–17.0)
Lymphs Abs: 1.6 10*3/uL (ref 0.7–4.0)
MCH: 36.7 pg — ABNORMAL HIGH (ref 26.0–34.0)
MCV: 103.2 fL — ABNORMAL HIGH (ref 78.0–100.0)
Monocytes Absolute: 1.2 10*3/uL — ABNORMAL HIGH (ref 0.1–1.0)
Monocytes Relative: 23 % — ABNORMAL HIGH (ref 3–12)
Platelets: 156 10*3/uL (ref 150–400)
RBC: 3.13 MIL/uL — ABNORMAL LOW (ref 4.22–5.81)

## 2012-10-02 LAB — BASIC METABOLIC PANEL
BUN: 9 mg/dL (ref 6–23)
Calcium: 8.2 mg/dL — ABNORMAL LOW (ref 8.4–10.5)
Creatinine, Ser: 0.61 mg/dL (ref 0.50–1.35)
GFR calc non Af Amer: >90 mL/min (ref >90)
Glucose, Bld: 95 mg/dL (ref 70–99)

## 2012-10-02 MED ORDER — INSULIN ASPART 100 UNIT/ML IV SOLN
10.0000 [IU] | Freq: Once | INTRAVENOUS | Status: AC
Start: 2012-10-02 — End: 2012-10-02
  Administered 2012-10-02: 10 [IU] via INTRAVENOUS

## 2012-10-02 MED ORDER — CALCIUM GLUCONATE 10 % IV SOLN
1.0000 g | Freq: Once | INTRAVENOUS | Status: AC
  Administered 2012-10-02: 1 g via INTRAVENOUS
  Filled 2012-10-02: qty 10

## 2012-10-02 MED ORDER — DEXTROSE 50 % IV SOLN
INTRAVENOUS | Status: AC
  Administered 2012-10-02: 50 mL via INTRAVENOUS
  Filled 2012-10-02: qty 50

## 2012-10-02 MED ORDER — GADOBENATE DIMEGLUMINE 529 MG/ML IV SOLN
15.0000 mL | Freq: Once | INTRAVENOUS | Status: AC | PRN
  Administered 2012-10-02: 15 mL via INTRAVENOUS

## 2012-10-02 MED ORDER — DEXTROSE 50 % IV SOLN: 1.0000 | Freq: Once | INTRAVENOUS | Status: AC

## 2012-10-02 NOTE — Telephone Encounter (Signed)
Kathlene November from MRI called- they drew blood to get a creatinine and found out pts K+ was 7.0. They checked it twice and got the same results. I paged LSL- per LSL pt needs to go to ED and be evaluated. I called Kathlene November back and informed him, he said he would tell the pt to go to the ED.

## 2012-10-02 NOTE — Progress Notes (Signed)
Blood sample obtained from left arm IV for Creatnine level.  

## 2012-10-02 NOTE — Progress Notes (Signed)
Repeat I-stat performed  First I-stat showed K+ of 7.1       Second I-stat K+ of 7.0       MD notified
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
## 2012-10-02 NOTE — ED Notes (Signed)
Sent to hospital today for CT scan due to "liver problems".  Chief complaint states K+ 7.0.  Pt states no one told him about elevated potassium.  Denies pain.  Interpreter at bedside to assist with translation.

## 2012-10-02 NOTE — Telephone Encounter (Signed)
Noted.   Looks like they were able to do the MRI.

## 2012-10-06 LAB — POCT I-STAT, CHEM 8
Calcium, Ion: 0.98 mmol/L — ABNORMAL LOW (ref 1.12–1.23)
Calcium, Ion: 0.98 mmol/L — ABNORMAL LOW (ref 1.12–1.23)
Chloride: 107 mEq/L (ref 96–112)
Chloride: 108 mEq/L (ref 96–112)
Glucose, Bld: 100 mg/dL — ABNORMAL HIGH (ref 70–99)
Glucose, Bld: 102 mg/dL — ABNORMAL HIGH (ref 70–99)
HCT: 35 % — ABNORMAL LOW (ref 39.0–52.0)
HCT: 36 % — ABNORMAL LOW (ref 39.0–52.0)
Hemoglobin: 11.9 g/dL — ABNORMAL LOW (ref 13.0–17.0)

## 2012-10-27 ENCOUNTER — Ambulatory Visit: Payer: Self-pay | Admitting: Gastroenterology

## 2012-10-27 ENCOUNTER — Telehealth: Payer: Self-pay | Admitting: Gastroenterology

## 2012-10-27 NOTE — Telephone Encounter (Signed)
Pt was a no show and then family member called and spoke with CM that daughter was bringing him and they were lost. I was told to direct them to the ED if they should show up since his OV was scheduled for 130pm and they would be late.

## 2012-11-12 ENCOUNTER — Inpatient Hospital Stay (HOSPITAL_COMMUNITY)
Admission: EM | Admit: 2012-11-12 | Discharge: 2012-11-18 | DRG: 442 | Disposition: A | Discharge status: Home Health | Payer: Medicaid Other | Attending: Internal Medicine | Admitting: Internal Medicine

## 2012-11-12 ENCOUNTER — Ambulatory Visit (INDEPENDENT_AMBULATORY_CARE_PROVIDER_SITE_OTHER): Payer: Medicaid Other | Admitting: Gastroenterology

## 2012-11-12 ENCOUNTER — Encounter: Payer: Self-pay | Admitting: Gastroenterology

## 2012-11-12 ENCOUNTER — Inpatient Hospital Stay (HOSPITAL_COMMUNITY): Payer: Medicaid Other

## 2012-11-12 ENCOUNTER — Encounter (HOSPITAL_COMMUNITY): Payer: Self-pay | Admitting: *Deleted

## 2012-11-12 VITALS — BP 153/80 | HR 85 | Temp 97.8°F | Ht 62.0 in | Wt 156.0 lb

## 2012-11-12 DIAGNOSIS — K7010 Alcoholic hepatitis without ascites: Secondary | ICD-10-CM | POA: Diagnosis present

## 2012-11-12 DIAGNOSIS — K7290 Hepatic failure, unspecified without coma: Principal | ICD-10-CM | POA: Diagnosis present

## 2012-11-12 DIAGNOSIS — R112 Nausea with vomiting, unspecified: Secondary | ICD-10-CM | POA: Diagnosis present

## 2012-11-12 DIAGNOSIS — Z91199 Patient's noncompliance with other medical treatment and regimen due to unspecified reason: Secondary | ICD-10-CM

## 2012-11-12 DIAGNOSIS — K7030 Alcoholic cirrhosis of liver without ascites: Secondary | ICD-10-CM

## 2012-11-12 DIAGNOSIS — R7989 Other specified abnormal findings of blood chemistry: Secondary | ICD-10-CM

## 2012-11-12 DIAGNOSIS — F10939 Alcohol use, unspecified with withdrawal, unspecified: Secondary | ICD-10-CM | POA: Diagnosis present

## 2012-11-12 DIAGNOSIS — E8809 Other disorders of plasma-protein metabolism, not elsewhere classified: Secondary | ICD-10-CM | POA: Diagnosis present

## 2012-11-12 DIAGNOSIS — F1010 Alcohol abuse, uncomplicated: Secondary | ICD-10-CM

## 2012-11-12 DIAGNOSIS — R1013 Epigastric pain: Secondary | ICD-10-CM

## 2012-11-12 DIAGNOSIS — R509 Fever, unspecified: Secondary | ICD-10-CM | POA: Diagnosis present

## 2012-11-12 DIAGNOSIS — R601 Generalized edema: Secondary | ICD-10-CM | POA: Diagnosis present

## 2012-11-12 DIAGNOSIS — D649 Anemia, unspecified: Secondary | ICD-10-CM

## 2012-11-12 DIAGNOSIS — K8020 Calculus of gallbladder without cholecystitis without obstruction: Secondary | ICD-10-CM

## 2012-11-12 DIAGNOSIS — R634 Abnormal weight loss: Secondary | ICD-10-CM

## 2012-11-12 DIAGNOSIS — F1020 Alcohol dependence, uncomplicated: Secondary | ICD-10-CM | POA: Diagnosis present

## 2012-11-12 DIAGNOSIS — I81 Portal vein thrombosis: Secondary | ICD-10-CM

## 2012-11-12 DIAGNOSIS — Z23 Encounter for immunization: Secondary | ICD-10-CM

## 2012-11-12 DIAGNOSIS — E876 Hypokalemia: Secondary | ICD-10-CM | POA: Diagnosis present

## 2012-11-12 DIAGNOSIS — F10239 Alcohol dependence with withdrawal, unspecified: Secondary | ICD-10-CM | POA: Diagnosis present

## 2012-11-12 DIAGNOSIS — K7460 Unspecified cirrhosis of liver: Secondary | ICD-10-CM

## 2012-11-12 DIAGNOSIS — D696 Thrombocytopenia, unspecified: Secondary | ICD-10-CM

## 2012-11-12 DIAGNOSIS — Z79899 Other long term (current) drug therapy: Secondary | ICD-10-CM

## 2012-11-12 DIAGNOSIS — R1110 Vomiting, unspecified: Secondary | ICD-10-CM | POA: Diagnosis present

## 2012-11-12 DIAGNOSIS — R600 Localized edema: Secondary | ICD-10-CM

## 2012-11-12 DIAGNOSIS — R1011 Right upper quadrant pain: Secondary | ICD-10-CM

## 2012-11-12 DIAGNOSIS — E871 Hypo-osmolality and hyponatremia: Secondary | ICD-10-CM | POA: Diagnosis present

## 2012-11-12 DIAGNOSIS — Z833 Family history of diabetes mellitus: Secondary | ICD-10-CM

## 2012-11-12 DIAGNOSIS — R609 Edema, unspecified: Secondary | ICD-10-CM | POA: Diagnosis present

## 2012-11-12 DIAGNOSIS — D6959 Other secondary thrombocytopenia: Secondary | ICD-10-CM | POA: Diagnosis present

## 2012-11-12 DIAGNOSIS — Z9119 Patient's noncompliance with other medical treatment and regimen: Secondary | ICD-10-CM

## 2012-11-12 DIAGNOSIS — K625 Hemorrhage of anus and rectum: Secondary | ICD-10-CM

## 2012-11-12 DIAGNOSIS — R772 Abnormality of alphafetoprotein: Secondary | ICD-10-CM

## 2012-11-12 DIAGNOSIS — K7682 Hepatic encephalopathy: Principal | ICD-10-CM | POA: Diagnosis present

## 2012-11-12 LAB — COMPREHENSIVE METABOLIC PANEL
CO2: 26 mEq/L (ref 19–32)
Calcium: 7.8 mg/dL — ABNORMAL LOW (ref 8.4–10.5)
Chloride: 95 mEq/L — ABNORMAL LOW (ref 96–112)
Creatinine, Ser: 0.62 mg/dL (ref 0.50–1.35)
GFR calc Af Amer: >90 mL/min (ref >90)
GFR calc non Af Amer: >90 mL/min (ref >90)
Glucose, Bld: 119 mg/dL — ABNORMAL HIGH (ref 70–99)
Total Bilirubin: 4.6 mg/dL — ABNORMAL HIGH (ref 0.3–1.2)

## 2012-11-12 LAB — CBC WITH DIFFERENTIAL/PLATELET
Basophils Relative: 1 % (ref 0–1)
Eosinophils Absolute: 0 10*3/uL (ref 0.0–0.7)
Eosinophils Relative: 0 % (ref 0–5)
Hemoglobin: 11.8 g/dL — ABNORMAL LOW (ref 13.0–17.0)
Lymphocytes Relative: 11 % — ABNORMAL LOW (ref 12–46)
MCHC: 35.2 g/dL (ref 30.0–36.0)
Monocytes Absolute: 0.7 10*3/uL (ref 0.1–1.0)
Neutrophils Relative %: 71 % (ref 43–77)
Platelets: 67 10*3/uL — ABNORMAL LOW (ref 150–400)
RBC: 3.4 MIL/uL — ABNORMAL LOW (ref 4.22–5.81)

## 2012-11-12 LAB — LACTIC ACID, PLASMA: Lactic Acid, Venous: 1 mmol/L (ref 0.5–2.2)

## 2012-11-12 LAB — LIPASE, BLOOD: Lipase: 57 U/L (ref 11–59)

## 2012-11-12 LAB — AMMONIA: Ammonia: 79 umol/L — ABNORMAL HIGH (ref 11–60)

## 2012-11-12 MED ORDER — LORAZEPAM 1 MG PO TABS: 1.0000 mg | ORAL_TABLET | Freq: Four times a day (QID) | ORAL | Status: AC | PRN

## 2012-11-12 MED ORDER — SODIUM CHLORIDE 0.9 % IJ SOLN
3.0000 mL | Freq: Two times a day (BID) | INTRAMUSCULAR | Status: DC
  Administered 2012-11-13 – 2012-11-18 (×10): 3 mL via INTRAVENOUS
  Filled 2012-11-12: qty 3

## 2012-11-12 MED ORDER — PANTOPRAZOLE SODIUM 40 MG PO TBEC
40.0000 mg | DELAYED_RELEASE_TABLET | Freq: Every morning | ORAL | Status: DC
  Administered 2012-11-13 – 2012-11-18 (×5): 40 mg via ORAL
  Filled 2012-11-12 (×7): qty 1

## 2012-11-12 MED ORDER — ONDANSETRON HCL 4 MG/2ML IJ SOLN: 4.0000 mg | Freq: Four times a day (QID) | INTRAMUSCULAR | Status: DC | PRN

## 2012-11-12 MED ORDER — DEXTROSE 5 % IV SOLN
1.0000 g | INTRAVENOUS | Status: DC
  Administered 2012-11-12: 1 g via INTRAVENOUS
  Filled 2012-11-12 (×4): qty 10

## 2012-11-12 MED ORDER — SODIUM CHLORIDE 0.9 % IV BOLUS (SEPSIS)
500.0000 mL | Freq: Once | INTRAVENOUS | Status: AC
  Administered 2012-11-12: 500 mL via INTRAVENOUS

## 2012-11-12 MED ORDER — PNEUMOCOCCAL VAC POLYVALENT 25 MCG/0.5ML IJ INJ
0.5000 mL | INJECTION | INTRAMUSCULAR | Status: AC
  Administered 2012-11-13: 0.5 mL via INTRAMUSCULAR
  Filled 2012-11-12: qty 0.5

## 2012-11-12 MED ORDER — LORAZEPAM 2 MG/ML IJ SOLN
0.0000 mg | Freq: Four times a day (QID) | INTRAMUSCULAR | Status: AC
  Administered 2012-11-13: 1 mg via INTRAVENOUS
  Administered 2012-11-14 (×2): 4 mg via INTRAVENOUS
  Administered 2012-11-14: 2 mg via INTRAVENOUS
  Filled 2012-11-12 (×2): qty 1
  Filled 2012-11-12 (×2): qty 2

## 2012-11-12 MED ORDER — LORAZEPAM 2 MG/ML IJ SOLN
0.0000 mg | Freq: Two times a day (BID) | INTRAMUSCULAR | Status: DC
  Administered 2012-11-14: 2 mg via INTRAVENOUS

## 2012-11-12 MED ORDER — ONDANSETRON HCL 4 MG/2ML IJ SOLN
4.0000 mg | Freq: Once | INTRAMUSCULAR | Status: AC
  Administered 2012-11-12: 4 mg via INTRAVENOUS
  Filled 2012-11-12: qty 2

## 2012-11-12 MED ORDER — THIAMINE HCL 100 MG/ML IJ SOLN
100.0000 mg | Freq: Every day | INTRAMUSCULAR | Status: DC
  Administered 2012-11-14: 100 mg via INTRAVENOUS
  Filled 2012-11-12 (×2): qty 2

## 2012-11-12 MED ORDER — ACETAMINOPHEN 650 MG RE SUPP: 650.0000 mg | Freq: Four times a day (QID) | RECTAL | Status: DC | PRN

## 2012-11-12 MED ORDER — FUROSEMIDE 10 MG/ML IJ SOLN
40.0000 mg | Freq: Two times a day (BID) | INTRAMUSCULAR | Status: DC
  Administered 2012-11-12 – 2012-11-14 (×4): 40 mg via INTRAVENOUS
  Filled 2012-11-12 (×4): qty 4

## 2012-11-12 MED ORDER — LORAZEPAM 2 MG/ML IJ SOLN
1.0000 mg | Freq: Four times a day (QID) | INTRAMUSCULAR | Status: AC | PRN
  Administered 2012-11-15: 1 mg via INTRAVENOUS
  Filled 2012-11-12 (×3): qty 1

## 2012-11-12 MED ORDER — LACTULOSE 10 GM/15ML PO SOLN
20.0000 g | Freq: Four times a day (QID) | ORAL | Status: DC
  Administered 2012-11-12 – 2012-11-17 (×18): 20 g via ORAL
  Filled 2012-11-12 (×19): qty 30

## 2012-11-12 MED ORDER — ADULT MULTIVITAMIN W/MINERALS CH
1.0000 | ORAL_TABLET | Freq: Every day | ORAL | Status: DC
  Administered 2012-11-12 – 2012-11-18 (×6): 1 via ORAL
  Filled 2012-11-12 (×7): qty 1

## 2012-11-12 MED ORDER — SODIUM CHLORIDE 0.9 % IV SOLN: INTRAVENOUS | Status: AC

## 2012-11-12 MED ORDER — VITAMIN B-1 100 MG PO TABS
100.0000 mg | ORAL_TABLET | Freq: Every day | ORAL | Status: DC
  Administered 2012-11-12 – 2012-11-18 (×6): 100 mg via ORAL
  Filled 2012-11-12 (×7): qty 1

## 2012-11-12 MED ORDER — SODIUM CHLORIDE 0.9 % IV SOLN
INTRAVENOUS | Status: DC
  Administered 2012-11-13: 10:00:00 via INTRAVENOUS

## 2012-11-12 MED ORDER — ONDANSETRON HCL 4 MG PO TABS: 4.0000 mg | ORAL_TABLET | Freq: Four times a day (QID) | ORAL | Status: DC | PRN

## 2012-11-12 MED ORDER — FOLIC ACID 1 MG PO TABS
1.0000 mg | ORAL_TABLET | Freq: Every day | ORAL | Status: DC
  Administered 2012-11-12 – 2012-11-18 (×6): 1 mg via ORAL
  Filled 2012-11-12 (×7): qty 1

## 2012-11-12 MED ORDER — ACETAMINOPHEN 325 MG PO TABS
650.0000 mg | ORAL_TABLET | Freq: Four times a day (QID) | ORAL | Status: DC | PRN
  Administered 2012-11-15: 650 mg via ORAL
  Filled 2012-11-12: qty 2

## 2012-11-12 MED ORDER — DEXTROSE 5 % IV SOLN
INTRAVENOUS | Status: AC
  Filled 2012-11-12: qty 10

## 2012-11-12 NOTE — Assessment & Plan Note (Addendum)
57 year old male with history of ETOH cirrhosis, alcoholic hepatitis, presenting with his wife today regarding N/V, epigastric pain. Acute onset several days ago, no fever or chills noted. However, he has had several episodes of urinary incontinence, one while in office with [place] today. He is confused regarding the year, and +icterus noted on physical exam, +asterixis. It is unclear if he has been taking lactulose as prescribed, and his wife states he has been noticeably more tired, sleeping most of the day. HE HAS RESUMED DRINKING ALCOHOL, in fact, he admits to drinking again today. He has a known history of gallstones. Due to his somewhat complicated history, I have requested that he and his wife present to the ED for evaluation as appropriate. Etiology of epigastric pain, N/V unclear; further evaluation as appropriate. I am concerned for a significant deterioration in his current health status. If patient is admitted, GI would be more than happy to follow. For now, to ED for further evaluation. I notified Darel Hong, triage RN of patient's upcoming arrival. Wife is agreeable to this and thankful, stating she is concerned.

## 2012-11-12 NOTE — Patient Instructions (Addendum)
Go straight to the ED. They will evaluate you.

## 2012-11-12 NOTE — ED Notes (Signed)
Sent from Dr. Marcie Bal office for evaluation of liver cirrhosis with increased confusion.  The patient states that he last drank alcohol a few hours ago.

## 2012-11-12 NOTE — Progress Notes (Signed)
Cc PCP 
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
## 2012-11-13 DIAGNOSIS — K7010 Alcoholic hepatitis without ascites: Secondary | ICD-10-CM

## 2012-11-13 DIAGNOSIS — K7290 Hepatic failure, unspecified without coma: Principal | ICD-10-CM

## 2012-11-13 DIAGNOSIS — D696 Thrombocytopenia, unspecified: Secondary | ICD-10-CM

## 2012-11-13 DIAGNOSIS — F1010 Alcohol abuse, uncomplicated: Secondary | ICD-10-CM

## 2012-11-13 DIAGNOSIS — K7030 Alcoholic cirrhosis of liver without ascites: Secondary | ICD-10-CM

## 2012-11-13 LAB — COMPREHENSIVE METABOLIC PANEL
Alkaline Phosphatase: 174 U/L — ABNORMAL HIGH (ref 39–117)
BUN: 10 mg/dL (ref 6–23)
CO2: 27 mEq/L (ref 19–32)
Chloride: 97 mEq/L (ref 96–112)
GFR calc Af Amer: >90 mL/min (ref >90)
Glucose, Bld: 99 mg/dL (ref 70–99)
Potassium: 3.4 mEq/L — ABNORMAL LOW (ref 3.5–5.1)
Total Bilirubin: 5.8 mg/dL — ABNORMAL HIGH (ref 0.3–1.2)

## 2012-11-13 LAB — URINALYSIS, ROUTINE W REFLEX MICROSCOPIC
Bilirubin Urine: NEGATIVE
Ketones, ur: NEGATIVE mg/dL
Nitrite: NEGATIVE
Protein, ur: NEGATIVE mg/dL
Specific Gravity, Urine: <1.005 — ABNORMAL LOW (ref 1.005–1.030)
Urobilinogen, UA: 1 mg/dL (ref 0.0–1.0)

## 2012-11-13 LAB — PROTIME-INR: INR: 2.92 — ABNORMAL HIGH (ref 0.00–1.49)

## 2012-11-13 LAB — AMMONIA: Ammonia: 75 umol/L — ABNORMAL HIGH (ref 11–60)

## 2012-11-13 LAB — CBC
MCHC: 35.6 g/dL (ref 30.0–36.0)
Platelets: 75 10*3/uL — ABNORMAL LOW (ref 150–400)
RDW: 16 % — ABNORMAL HIGH (ref 11.5–15.5)
WBC: 5 10*3/uL (ref 4.0–10.5)

## 2012-11-13 LAB — CLOSTRIDIUM DIFFICILE BY PCR: Toxigenic C. Difficile by PCR: NEGATIVE

## 2012-11-13 MED ORDER — PREDNISOLONE 5 MG PO TABS: 40.0000 mg | ORAL_TABLET | Freq: Every day | ORAL | Status: DC

## 2012-11-13 MED ORDER — PREDNISONE 20 MG PO TABS
40.0000 mg | ORAL_TABLET | Freq: Every day | ORAL | Status: DC
  Administered 2012-11-13 – 2012-11-14 (×2): 40 mg via ORAL
  Filled 2012-11-13 (×2): qty 2

## 2012-11-13 NOTE — Progress Notes (Signed)
Subjective:  Please see outpatient note dated 12/02/12 by Gerrit Halls, NP.  Patient alert and oriented to person, place and time today. Denies N/V/D since yesterday. No abdominal pain. No melena, brbpr. Wants to eat.  Objective: Vital signs in last 24 hours: Temp:  [97.8 F (36.6 C)-100.4 F (38 C)] 98.4 F (36.9 C) (05/23 0657) Pulse Rate:  [67-85] 67 (05/23 0657) Resp:  [18-20] 20 (05/23 0657) BP: (140-157)/(64-85) 146/83 mmHg (05/23 0657) SpO2:  [96 %-100 %] 100 % (05/23 0657) Weight:  [156 lb (70.761 kg)] 156 lb (70.761 kg) 2022-12-03 1521) Last BM Date: 11/11/12 General:   Alert,  pleasant and cooperative in NAD Head:  Normocephalic and atraumatic. Eyes:   + icterus.  Chest: CTA bilaterally without rales, rhonchi, crackles.    Heart:  Regular rate and rhythm; no murmurs, clicks, rubs,  or gallops. Abdomen:  Soft, nontender and slightly distended but without significant ascites. Normal bowel sounds, without guarding, and without rebound.   Extremities:  Without clubbing, deformity or edema. Neurologic:  Alert and  oriented x4;  grossly normal neurologically. -asterixis. Skin:  Intact without significant lesions or rashes. Psych:  Alert and cooperative. Normal mood and affect.  Intake/Output from previous day:   Intake/Output this shift:    Lab Results: CBC  Recent Labs  December 02, 2012 1612 11/13/12 0639  WBC 4.1 5.0  HGB 11.8* 12.5*  HCT 33.5* 35.1*  MCV 98.5 98.6  PLT 67* 75*   BMET  Recent Labs  12-02-2012 1612 11/13/12 0639  NA 129* 132*  K 3.8 3.4*  CL 95* 97  CO2 26 27  GLUCOSE 119* 99  BUN 10 10  CREATININE 0.62 0.60  CALCIUM 7.8* 7.9*   LFTs  Recent Labs  12/02/2012 1612 11/13/12 0639  BILITOT 4.6* 5.8*  ALKPHOS 203* 174*  AST 74* 81*  ALT 20 23  PROT 8.1 8.2  ALBUMIN 1.8* 1.9*    Recent Labs  12/02/12 1612  LIPASE 57   PT/INR  Recent Labs  11/13/12 0639  LABPROT 29.0*  INR 2.92*   Ammonia: 79-->75    Imaging Studies: Portable Chest 1  View  12/02/12   *RADIOLOGY REPORT*  Clinical Data: Fever, weakness, history of alcoholic cirrhosis  PORTABLE CHEST - 1 VIEW  Comparison: Portable exam 2039 hours compared to 09/09/2012  Findings: Enlargement of cardiac silhouette. Slight pulmonary vascular congestion. Minimal elongation of thoracic aorta. Elevation of right diaphragm with right basilar atelectasis. Lungs otherwise clear. No pleural effusion or pneumothorax.  IMPRESSION: Enlargement of cardiac silhouette with pulmonary vascular congestion. Minimal right basilar atelectasis.   Original Report Authenticated By: Ulyses Southward, M.D.  [2 weeks]   Assessment: 57 y/o Hispanic male with severe ETOH hepatitis, cirrhosis, hepatic encephalopathy. He has failed inpatient rehab previously. States he abstained from etoh about 3 weeks this last time but started back about 3 weeks ago. His hepatic discriminant factor is 84. Hepatic encephalopathy has improved. Currently on Rocephin for ?SBP. No significant ascites to draw off.  Urine and stool specimens have not been collected. Etiology of HE not known at this time. He has known gallstones but abd exam benign. Doubt acute cholecystitis. ?gastroenteritis.  Plan: 1. Prednisolone 40mg  PO for 28 days with taper over 1-2 weeks.  2. Stop etoh. 3. To discuss with Dr. Darrick Penna. I do not think he has enough ascites to tap and submit for studies.    LOS: 1 day   Tana Coast  11/13/2012, 7:41 AM

## 2012-11-13 NOTE — Progress Notes (Signed)
Utilization Review Complete  

## 2012-11-13 NOTE — Progress Notes (Addendum)
REVIEWED. AGREE.  MS CHANGES MOST LIKELY DUE TO HEPATIC DECOMPENSATION FROM CONTINUED ETOH AND MEDICAL NON-ADHERENCE. D/C ROCEPHIN.

## 2012-11-13 NOTE — Progress Notes (Signed)
     Subjective: This patient was admitted yesterday with nausea and vomiting, possibly confusion. This morning he feels well, there is no nausea vomiting and he wants to eat.           Physical Exam: Blood pressure 146/83, pulse 67, temperature 98.4 F (36.9 C), temperature source Oral, resp. rate 20, height 5\' 4"  (1.626 m), weight 70.761 kg (156 lb), SpO2 100.00%. Looks chronically sick. Heart sounds are present and normal. Lung fields are clear. He does have minimal signs of hepatic encephalopathy.   Investigations:  No results found for this or any previous visit (from the past 240 hour(s)).   Basic Metabolic Panel:  Recent Labs  16/10/96 1612 11/13/12 0639  NA 129* 132*  K 3.8 3.4*  CL 95* 97  CO2 26 27  GLUCOSE 119* 99  BUN 10 10  CREATININE 0.62 0.60  CALCIUM 7.8* 7.9*   Liver Function Tests:  Recent Labs  11/12/12 1612 11/13/12 0639  AST 74* 81*  ALT 20 23  ALKPHOS 203* 174*  BILITOT 4.6* 5.8*  PROT 8.1 8.2  ALBUMIN 1.8* 1.9*     CBC:  Recent Labs  11/12/12 1612 11/13/12 0639  WBC 4.1 5.0  NEUTROABS 2.9  --   HGB 11.8* 12.5*  HCT 33.5* 35.1*  MCV 98.5 98.6  PLT 67* 75*    Portable Chest 1 View  11/12/2012   *RADIOLOGY REPORT*  Clinical Data: Fever, weakness, history of alcoholic cirrhosis  PORTABLE CHEST - 1 VIEW  Comparison: Portable exam 2039 hours compared to 09/09/2012  Findings: Enlargement of cardiac silhouette. Slight pulmonary vascular congestion. Minimal elongation of thoracic aorta. Elevation of right diaphragm with right basilar atelectasis. Lungs otherwise clear. No pleural effusion or pneumothorax.  IMPRESSION: Enlargement of cardiac silhouette with pulmonary vascular congestion. Minimal right basilar atelectasis.   Original Report Authenticated By: Ulyses Southward, M.D.      Medications: I have reviewed the patient's current medications.  Impression: 1. Alcoholic hepatic encephalopathy, somewhat improving. 2.  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 3. Alcoholism. 4. Hypokalemia.     Plan: 1. Continue with current therapy . 2. Replete potassium. 3. Further recommendations from gastroenterology appreciated.     LOS: 1 day   Wilson Singer Pager [phone]  11/13/2012, 11:47 AM

## 2012-11-14 DIAGNOSIS — K7290 Hepatic failure, unspecified without coma: Secondary | ICD-10-CM

## 2012-11-14 LAB — CBC
MCH: 34.7 pg — ABNORMAL HIGH (ref 26.0–34.0)
Platelets: 85 10*3/uL — ABNORMAL LOW (ref 150–400)
RBC: 3.4 MIL/uL — ABNORMAL LOW (ref 4.22–5.81)
RDW: 15.6 % — ABNORMAL HIGH (ref 11.5–15.5)
WBC: 8.5 10*3/uL (ref 4.0–10.5)

## 2012-11-14 LAB — COMPREHENSIVE METABOLIC PANEL
ALT: 25 U/L (ref 0–53)
AST: 85 U/L — ABNORMAL HIGH (ref 0–37)
Albumin: 1.7 g/dL — ABNORMAL LOW (ref 3.5–5.2)
CO2: 27 mEq/L (ref 19–32)
Calcium: 7.9 mg/dL — ABNORMAL LOW (ref 8.4–10.5)
GFR calc non Af Amer: >90 mL/min (ref >90)
Sodium: 133 mEq/L — ABNORMAL LOW (ref 135–145)

## 2012-11-14 LAB — PROTIME-INR: INR: 3.38 — ABNORMAL HIGH (ref 0.00–1.49)

## 2012-11-14 MED ORDER — VITAMIN K1 10 MG/ML IJ SOLN
10.0000 mg | Freq: Once | INTRAMUSCULAR | Status: AC
  Administered 2012-11-14: 10 mg via SUBCUTANEOUS
  Filled 2012-11-14: qty 1

## 2012-11-14 MED ORDER — KCL IN DEXTROSE-NACL 40-5-0.9 MEQ/L-%-% IV SOLN
INTRAVENOUS | Status: DC
  Administered 2012-11-14: 13:00:00 via INTRAVENOUS
  Filled 2012-11-14 (×3): qty 1000

## 2012-11-14 MED ORDER — HALOPERIDOL LACTATE 5 MG/ML IJ SOLN
2.0000 mg | Freq: Once | INTRAMUSCULAR | Status: AC
  Administered 2012-11-14: 2 mg via INTRAVENOUS
  Filled 2012-11-14: qty 1

## 2012-11-14 MED ORDER — PREDNISONE 20 MG PO TABS: 20.0000 mg | ORAL_TABLET | Freq: Every day | ORAL | Status: DC

## 2012-11-14 MED ORDER — POTASSIUM CHLORIDE CRYS ER 20 MEQ PO TBCR
40.0000 meq | EXTENDED_RELEASE_TABLET | Freq: Once | ORAL | Status: AC
  Administered 2012-11-14: 40 meq via ORAL
  Filled 2012-11-14: qty 2

## 2012-11-14 MED ORDER — PREDNISONE 20 MG PO TABS
40.0000 mg | ORAL_TABLET | Freq: Every day | ORAL | Status: DC
  Administered 2012-11-15 – 2012-11-17 (×3): 40 mg via ORAL
  Filled 2012-11-14 (×3): qty 2

## 2012-11-14 MED ORDER — HALOPERIDOL LACTATE 5 MG/ML IJ SOLN
2.5000 mg | Freq: Once | INTRAMUSCULAR | Status: AC
  Administered 2012-11-14: 2.5 mg via INTRAVENOUS
  Filled 2012-11-14: qty 1

## 2012-11-14 NOTE — Progress Notes (Signed)
Pt rested much of the day, but he was quite agitated when he did wake up. He was able to eat lunch and dinner. CIWA scale followed, and has been very effective today. Sheryn Bison

## 2012-11-14 NOTE — Progress Notes (Signed)
Subjective; Patient has no complaints. He is keeping his eyes closed. He denies abdominal pain. Objective; BP 138/71  Pulse 76  Temp(Src) 97.3 F (36.3 C) (Oral)  Resp 20  Ht 5\' 4"  (1.626 m)  Wt 156 lb (70.761 kg)  BMI 26.76 kg/m2  SpO2 100% Patient is being assisted with this meal and has no difficulty swallowing. He prefers to keep his eyes closed. Hands covered with mittens for safety. Abdomen is full with bulging flanks but soft and nontender. Lab data; WBC 8.5 H&H 11.8 and 33.2 Platelet count 85K Serum potassium 3.2. Serum albumin 1.7. Total bilirubin 5.3, AB 178, AST 85, ALT 25 INR 3.38. Serum ammonia was 75 yesterday Assessment; Decompensated alcoholic liver disease with hepatic encephalopathy coagulopathy cholestasis and ascites. Hepatic function is deteriorating as evidenced by elevated INR. Will give single dose of vitamin K to make sure he doesn't have vitamin K deficiency. Patient remains on prednisone. Recommendations; Vit. K. subcutaneous x1. Repeat labs in a.m.

## 2012-11-14 NOTE — Progress Notes (Signed)
     Subjective: This patient was admitted  with nausea and vomiting, possibly confusion. This morning he appears to be very confused and is taking at his mittens.           Physical Exam: Blood pressure 138/71, pulse 76, temperature 97.3 F (36.3 C), temperature source Oral, resp. rate 20, height 5\' 4"  (1.626 m), weight 70.761 kg (156 lb), SpO2 100.00%. Looks chronically sick. Heart sounds are present and normal. Lung fields are clear. He does have signs of clear hepatic encephalopathy now.   Investigations:  Recent Results (from the past 240 hour(s))  CLOSTRIDIUM DIFFICILE BY PCR     Status: None   Collection Time    11/13/12  2:30 PM      Result Value Range Status   C difficile by pcr NEGATIVE  NEGATIVE Final     Basic Metabolic Panel:  Recent Labs  16/10/96 0639 11/14/12 0640  NA 132* 133*  K 3.4* 3.2*  CL 97 97  CO2 27 27  GLUCOSE 99 113*  BUN 10 12  CREATININE 0.60 0.67  CALCIUM 7.9* 7.9*   Liver Function Tests:  Recent Labs  11/13/12 0639 11/14/12 0640  AST 81* 85*  ALT 23 25  ALKPHOS 174* 178*  BILITOT 5.8* 5.3*  PROT 8.2 7.3  ALBUMIN 1.9* 1.7*     CBC:  Recent Labs  11/12/12 1612 11/13/12 0639 11/14/12 0640  WBC 4.1 5.0 8.5  NEUTROABS 2.9  --   --   HGB 11.8* 12.5* 11.8*  HCT 33.5* 35.1* 33.2*  MCV 98.5 98.6 97.6  PLT 67* 75* 85*    Portable Chest 1 View  11/12/2012   *RADIOLOGY REPORT*  Clinical Data: Fever, weakness, history of alcoholic cirrhosis  PORTABLE CHEST - 1 VIEW  Comparison: Portable exam 2039 hours compared to 09/09/2012  Findings: Enlargement of cardiac silhouette. Slight pulmonary vascular congestion. Minimal elongation of thoracic aorta. Elevation of right diaphragm with right basilar atelectasis. Lungs otherwise clear. No pleural effusion or pneumothorax.  IMPRESSION: Enlargement of cardiac silhouette with pulmonary vascular congestion. Minimal right basilar atelectasis.   Original Report Authenticated By: Ulyses Southward, M.D.      Medications: I have reviewed the patient's current medications.  Impression: 1. Alcoholic hepatic encephalopathy, getting worse. 2. 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 3. Alcoholism. 4. Hypokalemia. 5. Worsening liver function with increasing INR.     Plan: 1. Continue with supportive current therapy . 2. Replete potassium.      LOS: 2 days   Wilson Singer Pager [phone]  11/14/2012, 9:54 AM

## 2012-11-15 LAB — COMPREHENSIVE METABOLIC PANEL
ALT: 41 U/L (ref 0–53)
AST: 136 U/L — ABNORMAL HIGH (ref 0–37)
Alkaline Phosphatase: 155 U/L — ABNORMAL HIGH (ref 39–117)
CO2: 28 mEq/L (ref 19–32)
Calcium: 7.4 mg/dL — ABNORMAL LOW (ref 8.4–10.5)
Chloride: 104 mEq/L (ref 96–112)
GFR calc Af Amer: >90 mL/min (ref >90)
GFR calc non Af Amer: >90 mL/min (ref >90)
Glucose, Bld: 93 mg/dL (ref 70–99)
Potassium: 4.1 mEq/L (ref 3.5–5.1)
Sodium: 135 mEq/L (ref 135–145)
Total Bilirubin: 6.7 mg/dL — ABNORMAL HIGH (ref 0.3–1.2)

## 2012-11-15 NOTE — Progress Notes (Signed)
  Austin Ruiz:[id number] DOB: 1955-12-31 DOA: 11/12/2012 PCP: Estanislado Pandy, MD   Subjective: He seems more alert today and has had breakfast. He is beginning to take oral intake now.           Physical Exam: Blood pressure 174/74, pulse 63, temperature 98.1 F (36.7 C), temperature source Oral, resp. rate 20, height 5\' 4"  (1.626 m), weight 73.5 kg (162 lb 0.6 oz), SpO2 97.00%. He looks less agitated and less delirious/confused than yesterday. Abdomen is soft and nontender. Has jaundice.   Investigations:  Recent Results (from the past 240 hour(s))  CLOSTRIDIUM DIFFICILE BY PCR     Status: None   Collection Time    11/13/12  2:30 PM      Result Value Range Status   C difficile by pcr NEGATIVE  NEGATIVE Final     Basic Metabolic Panel:  Recent Labs  95/62/13 0640 11/15/12 0800  NA 133* 135  K 3.2* 4.1  CL 97 104  CO2 27 28  GLUCOSE 113* 93  BUN 12 12  CREATININE 0.67 0.58  CALCIUM 7.9* 7.4*   Liver Function Tests:  Recent Labs  11/14/12 0640 11/15/12 0800  AST 85* 136*  ALT 25 41  ALKPHOS 178* 155*  BILITOT 5.3* 6.7*  PROT 7.3 7.3  ALBUMIN 1.7* 1.6*     CBC:  Recent Labs  11/12/12 1612 11/13/12 0639 11/14/12 0640  WBC 4.1 5.0 8.5  NEUTROABS 2.9  --   --   HGB 11.8* 12.5* 11.8*  HCT 33.5* 35.1* 33.2*  MCV 98.5 98.6 97.6  PLT 67* 75* 85*        Medications: I have reviewed the patient's current medications.  Impression: 1. Alcoholic hepatitic encephalopathy, improving. 2. Alcoholic hepatitis. 3. Alcoholism. 4. 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     Plan: 1. Reduce IV fluids and encourage oral intake. 2. Reduce Ativan as he seems to be improving from his alcoholic encephalopathy and even withdrawal.  Consultants:  Gastroenterology, Dr. Karilyn Cota.   Procedures:  none   Antibiotics:  None.                   Code Status: Full code.  Family Communication: No family at bedside. Discussed plan with patient  who is able to understand today.   Disposition Plan: Home when medically stable.  Time spent: 20 minutes.   LOS: 3 days   Wilson Singer Pager [phone]  11/15/2012, 11:18 AM

## 2012-11-15 NOTE — Progress Notes (Signed)
Subjective; Patient denies abdominal pain nausea or vomiting. His appetite is poor. Objective; BP 174/74  Pulse 63  Temp(Src) 98.1 F (36.7 C) (Oral)  Resp 20  Ht 5\' 4"  (1.626 m)  Wt 162 lb 0.6 oz (73.5 kg)  BMI 27.8 kg/m2  SpO2 97% Patient appears to be comfortable in bed. He prefers to keep his eyes closed but he does respond to vocal stimuli. He does not know that he is in the hospital.  Sclerae is icteric. Abdomen is full but soft and nontender. He does not have peripheral edema. Lab data; INR is 3.44 Electrolytes within normal limits. BUN 12 creatinine oh 0.58. Bilirubin 6.7, AP 155, AST 136, ALT 41 and albumin is 1.6. Globulin is 5.7 Serum calcium 7.4 corrected serum calcium is 9.3. Assessment; Alcoholic liver disease with decompensation. I not has not corrected with vitamin K. Perhaps only good prognostic sign is that his renal function is preserved. Recommendations; Continue supportive therapy and prednisone. I have requested that staff assist patient at every mealtime

## 2012-11-16 LAB — BASIC METABOLIC PANEL
Calcium: 7.9 mg/dL — ABNORMAL LOW (ref 8.4–10.5)
Chloride: 99 mEq/L (ref 96–112)
Creatinine, Ser: 0.55 mg/dL (ref 0.50–1.35)
GFR calc Af Amer: >90 mL/min (ref >90)

## 2012-11-16 LAB — PROTIME-INR: Prothrombin Time: 31.9 seconds — ABNORMAL HIGH (ref 11.6–15.2)

## 2012-11-16 MED ORDER — LORAZEPAM 1 MG PO TABS
1.0000 mg | ORAL_TABLET | Freq: Four times a day (QID) | ORAL | Status: DC | PRN
  Administered 2012-11-16: 1 mg via ORAL
  Filled 2012-11-16 (×2): qty 1

## 2012-11-16 MED ORDER — LORAZEPAM 2 MG/ML IJ SOLN
1.0000 mg | INTRAMUSCULAR | Status: AC
  Administered 2012-11-16: 1 mg via INTRAVENOUS
  Filled 2012-11-16: qty 1

## 2012-11-16 MED ORDER — LORAZEPAM 2 MG/ML IJ SOLN
0.5000 mg | Freq: Four times a day (QID) | INTRAMUSCULAR | Status: DC
  Administered 2012-11-16 – 2012-11-17 (×3): 0.5 mg via INTRAVENOUS
  Filled 2012-11-16 (×3): qty 1

## 2012-11-16 NOTE — Progress Notes (Signed)
Still no word from MD. Pager system seems to be down. Will attempt again. Pt calmer with sitter conversation at this time.

## 2012-11-16 NOTE — Evaluation (Signed)
Physical Therapy Evaluation Patient Details Name: Austin Ruiz MRN: [id number] DOB: [birth year] Today's Date: 11/16/2012 Time: 4098-1191 PT Time Calculation (min): 23 min  PT Assessment / Plan / Recommendation Clinical Impression  Pt was seen for evaluation.  He is on the Ativan protocol for alcohol abuse and has been medicated within the past hour.  Pt is awake, however, and able to answer questions.  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  He apparently lives in an apartment as he has a flight of steps to enter his home.  Pt is able to follow directions and his gait is ataxic.  He was provided a walker and ataxia is still present although reduced.  He is not safe with gait at this point.  I am not sure if this is partly medication induced or alcohol induced.  We will keep him on schedule to try to maximize gait safety.    PT Assessment  Patient needs continued PT services    Follow Up Recommendations  Home health PT    Does the patient have the potential to tolerate intense rehabilitation      Barriers to Discharge Inaccessible home environment flight of steps to enter home    Equipment Recommendations  None recommended by PT    Recommendations for Other Services     Frequency Min 3X/week    Precautions / Restrictions Precautions Precautions: Fall Restrictions Weight Bearing Restrictions: No   Pertinent Vitals/Pain       Mobility  Bed Mobility Bed Mobility: Supine to Sit;Sit to Supine Supine to Sit: 7: Independent;HOB elevated Sit to Supine: 7: Independent;HOB flat Transfers Transfers: Sit to Stand;Stand to Sit Sit to Stand: 6: Modified independent (Device/Increase time);Without upper extremity assist Stand to Sit: 6: Modified independent (Device/Increase time);Without upper extremity assist Ambulation/Gait Ambulation/Gait Assistance: 4: Min assist Ambulation Distance (Feet): 400 Feet Assistive device: Rolling walker;None Ambulation/Gait Assistance  Details: pt ambulated 200' with no AD and then 200' with a walker...his ataxia is lessened when he uses a walker, but it is still present to a large enough degree that his gait is not safe with a walker Gait Pattern: Ataxic Stairs: No Wheelchair Mobility Wheelchair Mobility: No    Exercises     PT Diagnosis: Abnormality of gait  PT Problem List: Decreased balance;Decreased safety awareness PT Treatment Interventions: Gait training;Stair training   PT Goals Acute Rehab PT Goals PT Goal Formulation: With patient Time For Goal Achievement: 11/23/12 Potential to Achieve Goals: Fair Pt will Ambulate: >150 feet;with supervision;with least restrictive assistive device PT Goal: Ambulate - Progress: Goal set today Pt will Go Up / Down Stairs: Flight;with supervision;with rail(s) PT Goal: Up/Down Stairs - Progress: Goal set today  Visit Information  Last PT Received On: 11/16/12    Subjective Data  Subjective: pt offers no thoughts Patient Stated Goal: none stated   Prior Functioning  Home Living Lives With: Spouse Available Help at Discharge: Family;Available 24 hours/day Type of Home: Apartment Home Access: Stairs to enter Entrance Stairs-Number of Steps: 10 Entrance Stairs-Rails: Right Home Layout: One level Bathroom Shower/Tub: Engineer, manufacturing systems: Standard Home Adaptive Equipment: Walker - rolling Prior Function Level of Independence: Independent Able to Take Stairs?: Yes Vocation: Part time employment Comments: 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 Musician: No difficulties    Copywriter, advertising Arousal/Alertness: Youth worker During Therapy: WFL for tasks assessed/performed Overall Cognitive Status: Within Functional Limits for tasks assessed    Extremity/Trunk Assessment Right Lower Extremity Assessment RLE ROM/Strength/Tone:  Within functional levels RLE Sensation: WFL - Light Touch Left Lower Extremity  Assessment LLE ROM/Strength/Tone: Within functional levels LLE Sensation: WFL - Light Touch Trunk Assessment Trunk Assessment: Normal   Balance Balance Balance Assessed: No  End of Session PT - End of Session Equipment Utilized During Treatment: Gait belt Activity Tolerance: Treatment limited secondary to medication Patient left: in bed;with call bell/phone within reach;with bed alarm set (sitter present) Nurse Communication: Mobility status  GP     Myrlene Broker L 11/16/2012, 1:16 PM

## 2012-11-16 NOTE — Progress Notes (Signed)
Pt agitated. CIWA 14. VSS. Pt insists on getting up and walking with the sitter. Unsteady gait. MD paged.

## 2012-11-16 NOTE — Progress Notes (Signed)
Pt in bed. Eyes closed. Calm. No sign of agitation or distress at this time. Sitter at bedside.

## 2012-11-16 NOTE — Progress Notes (Signed)
  Austin Ruiz ZOX:[id number] DOB: [birth year] DOA: 11/12/2012 PCP: Estanislado Pandy, MD   Subjective: He seems more alert today and has had breakfast. He is becoming more mobile with help.           Physical Exam: Blood pressure 150/61, pulse 87, temperature 98.2 F (36.8 C), temperature source Oral, resp. rate 20, height 5\' 4"  (1.626 m), weight 73.1 kg (161 lb 2.5 oz), SpO2 98.00%. He looks less agitated and less delirious/confused than yesterday. Abdomen is soft and nontender. Has jaundice. He is able to answer questions that I posed to him now.   Investigations:  Recent Results (from the past 240 hour(s))  URINE CULTURE     Status: None   Collection Time    11/13/12 11:20 AM      Result Value Range Status   Specimen Description URINE, RANDOM   Final   Special Requests NONE   Final   Culture  Setup Time 11/14/2012 01:52   Final   Colony Count PENDING   Incomplete   Culture Culture reincubated for better growth   Final   Report Status PENDING   Incomplete  CLOSTRIDIUM DIFFICILE BY PCR     Status: None   Collection Time    11/13/12  2:30 PM      Result Value Range Status   C difficile by pcr NEGATIVE  NEGATIVE Final     Basic Metabolic Panel:  Recent Labs  81/19/14 0800 11/16/12 0630  NA 135 129*  K 4.1 4.2  CL 104 99  CO2 28 23  GLUCOSE 93 106*  BUN 12 11  CREATININE 0.58 0.55  CALCIUM 7.4* 7.9*   Liver Function Tests:  Recent Labs  11/14/12 0640 11/15/12 0800  AST 85* 136*  ALT 25 41  ALKPHOS 178* 155*  BILITOT 5.3* 6.7*  PROT 7.3 7.3  ALBUMIN 1.7* 1.6*     CBC:  Recent Labs  11/14/12 0640  WBC 8.5  HGB 11.8*  HCT 33.2*  MCV 97.6  PLT 85*        Medications: I have reviewed the patient's current medications.  Impression: 1. Alcoholic hepatitic encephalopathy, improving. 2. Alcoholic hepatitis with liver decompensation, INR raised at 3.33, somewhat improved from yesterday. 3. Alcoholism. 4. Thrombocytopenia secondary to  alcoholism.     Plan: 1. Physical therapy evaluation. 2. Hopefully we can achieve disposition tomorrow depending on physical therapy recommendations.  Consultants:  Gastroenterology, Dr. Karilyn Cota.   Procedures:  none   Antibiotics:  None.                   Code Status: Full code.  Family Communication: No family at bedside. Discussed plan with patient who is able to understand today.   Disposition Plan: Home when medically stable.  Time spent: 20 minutes.   LOS: 4 days   Wilson Singer Pager [phone]  11/16/2012, 9:54 AM

## 2012-11-17 DIAGNOSIS — K7290 Hepatic failure, unspecified without coma: Secondary | ICD-10-CM

## 2012-11-17 DIAGNOSIS — K7030 Alcoholic cirrhosis of liver without ascites: Secondary | ICD-10-CM

## 2012-11-17 LAB — COMPREHENSIVE METABOLIC PANEL
CO2: 25 mEq/L (ref 19–32)
Calcium: 8 mg/dL — ABNORMAL LOW (ref 8.4–10.5)
Creatinine, Ser: 0.58 mg/dL (ref 0.50–1.35)
GFR calc Af Amer: >90 mL/min (ref >90)
GFR calc non Af Amer: >90 mL/min (ref >90)
Glucose, Bld: 121 mg/dL — ABNORMAL HIGH (ref 70–99)

## 2012-11-17 MED ORDER — LORAZEPAM 0.5 MG PO TABS
0.5000 mg | ORAL_TABLET | Freq: Two times a day (BID) | ORAL | Status: DC
  Administered 2012-11-17 – 2012-11-18 (×3): 0.5 mg via ORAL
  Filled 2012-11-17 (×3): qty 1

## 2012-11-17 MED ORDER — LACTULOSE 10 GM/15ML PO SOLN
30.0000 g | Freq: Four times a day (QID) | ORAL | Status: DC
  Administered 2012-11-17 (×2): 30 g via ORAL
  Filled 2012-11-17 (×2): qty 30
  Filled 2012-11-17: qty 60

## 2012-11-17 MED ORDER — PREDNISOLONE SODIUM PHOSPHATE 15 MG/5ML PO SOLN
40.0000 mg | Freq: Every day | ORAL | Status: DC
  Administered 2012-11-18: 40 mg via ORAL
  Filled 2012-11-17 (×3): qty 15

## 2012-11-17 NOTE — Progress Notes (Signed)
  Austin Ruiz:[id number] DOB: December 27, 1955 DOA: 11/12/2012 PCP: Estanislado Pandy, MD   Subjective: He seems more alert today but thinks he is in [place].           Physical Exam: Blood pressure 131/60, pulse 73, temperature 98.1 F (36.7 C), temperature source Oral, resp. rate 18, height 5\' 4"  (1.626 m), weight 71.2 kg (156 lb 15.5 oz), SpO2 100.00%. He looks less agitated and less delirious/confused than yesterday. Abdomen is soft and nontender. Has jaundice. He is able to answer questions that I posed to him now.   Investigations:  Recent Results (from the past 240 hour(s))  URINE CULTURE     Status: None   Collection Time    11/13/12 11:20 AM      Result Value Range Status   Specimen Description URINE, RANDOM   Final   Special Requests NONE   Final   Culture  Setup Time 11/14/2012 01:52   Final   Colony Count 20,OOO COLONIES/ML   Final   Culture     Final   Value: STAPHYLOCOCCUS SPECIES (COAGULASE NEGATIVE)     Note: RIFAMPIN AND GENTAMICIN SHOULD NOT BE USED AS SINGLE DRUGS FOR TREATMENT OF STAPH INFECTIONS.   Report Status PENDING   Incomplete  CLOSTRIDIUM DIFFICILE BY PCR     Status: None   Collection Time    11/13/12  2:30 PM      Result Value Range Status   C difficile by pcr NEGATIVE  NEGATIVE Final     Basic Metabolic Panel:  Recent Labs  95/62/13 0630 11/17/12 0612  NA 129* 132*  K 4.2 3.9  CL 99 99  CO2 23 25  GLUCOSE 106* 121*  BUN 11 11  CREATININE 0.55 0.58  CALCIUM 7.9* 8.0*   Liver Function Tests:  Recent Labs  11/15/12 0800 11/17/12 0612  AST 136* 105*  ALT 41 42  ALKPHOS 155* 197*  BILITOT 6.7* 5.0*  PROT 7.3 7.8  ALBUMIN 1.6* 1.8*            Medications: I have reviewed the patient's current medications.  Impression: 1. Alcoholic hepatitic encephalopathy, improving slowly. 2. Alcoholic hepatitis with liver decompensation, INR raised at 3.33, somewhat improved from yesterday. 3. Alcoholism. 4. Thrombocytopenia  secondary to alcoholism.     Plan: 1. Change IV Ativan to oral only twice a day now. 2. Hopefully, we can discharge him tomorrow depending on stability.  Consultants:  Gastroenterology, Dr. Karilyn Cota.   Procedures:  none   Antibiotics:  None.                   Code Status: Full code.  Family Communication: No family at bedside. Discussed plan with patient who is able to understand today.   Disposition Plan: Home when medically stable.  Time spent: 20 minutes.   LOS: 5 days   Wilson Singer Pager [phone]  11/17/2012, 10:14 AM

## 2012-11-17 NOTE — Care Management Note (Signed)
    Page 1 of 2   11/18/2012     3:30:49 PM   CARE MANAGEMENT NOTE 11/18/2012  Patient:  Austin Ruiz, Austin Ruiz   Account Number:  [id number]  Date Initiated:  11/17/2012  Documentation initiated by:  Sharrie Rothman  Subjective/Objective Assessment:   Pt admitted from home with hepatic encephalopathy and etoh abuse. Pt lives with his wife and will return home at discharge. Pt stated that he is independent with ADL's.     Action/Plan:   Will continue to follow for [facility] needs. CSW has consulted with pt in regards to etoh abuse.   Anticipated DC Date:  11/19/2012   Anticipated DC Plan:  HOME/SELF CARE  In-house referral  Clinical Social Worker      DC Planning Services  CM consult      Choice offered to / List presented to:          [facility] arranged  HH-1 RN  HH-2 PT      [facility] agency  [facility]   Status of service:  Completed, signed off Medicare Important Message given?   (If response is "NO", the following Medicare IM given date fields will be blank) Date Medicare IM given:   Date Additional Medicare IM given:    Discharge Disposition:  HOME W HOME HEALTH SERVICES  Per UR Regulation:    If discussed at Long Length of Stay Meetings, dates discussed:    Comments:  11/18/12 1530 Arlyss Queen, RN BNS CM Pt discharged home today with [facility] RN and PT. Alroy Bailiff of [facility] is aware and will collect the pts information from the chart. HH services to start within 48 hours. No DME needs noted. Pt and pts nurse aware of isicharge arrangements.  11/17/12 1500 Arlyss Queen, RN BSN CM CM spoke with pts wife and pt does have a PCP at [facility] Medicine Dr. Neita Carp. Pts wife is interested in [facility] RN and PT at discharge. Referral given to [facility].  11/17/12 1145 Arlyss Queen, RN BSN CM

## 2012-11-17 NOTE — Progress Notes (Signed)
Pt has continuously insisted on  walking  hallway throughout the night. Unsteady gait. Continuously pulling cardiac leads off but able to redirect with persistence.  Schedule ativan given. Sitter at bedside. VSS.

## 2012-11-17 NOTE — Progress Notes (Signed)
Physical Therapy Treatment Patient Details Name: ANATOLE APOLLO MRN: [id number] DOB: 03-Aug-1955 Today's Date: 11/17/2012 Time:  -     PT Assessment / Plan / Recommendation Comments on Treatment Session  Unable to work with patient, ha has just been sedated due to agitation.      Precautions / Restrictions Restrictions Weight Bearing Restrictions: No     Visit Information  Reason Eval/Treat Not Completed: Other (comment) (Unable to work with patient, ha has just been sedated due to)    Subjective Data  Subjective: Unable to work with patient, ha has just been sedated due to agitation.     End of Session PT - End of Session Patient left: in bed;with call bell/phone within reach;with bed alarm set;with nursing in room   GP     Juel Burrow 11/17/2012, 8:20 AM

## 2012-11-17 NOTE — Progress Notes (Signed)
Subjective: Patient at nurses station talking about the movie "ET". States it is 57, then 2000. In good spirits, pleasantly confused. Thinks he is in [place] Per Maralyn Sago, Charity fundraiser, only 2 BMs today.   Objective: Vital signs in last 24 hours: Temp:  [97.7 F (36.5 C)-98.1 F (36.7 C)] 98.1 F (36.7 C) (05/27 0500) Pulse Rate:  [73-77] 73 (05/27 0500) Resp:  [18] 18 (05/27 0500) BP: (131-142)/(60-82) 131/60 mmHg (05/27 0500) SpO2:  [100 %] 100 % (05/26 2100) Weight:  [156 lb 15.5 oz (71.2 kg)] 156 lb 15.5 oz (71.2 kg) (05/27 0500) Last BM Date: 11/16/12 Limited exam, patient at nurses station General:   Alert to person only.  Head:  Normocephalic and atraumatic. Eyes:  +icterus Abdomen:  Exam performed while patient standing up at nurses station. +BS, soft, unable to perform complete exam.  Extremities:  1+ edeam Neurologic:  Oriented to person only. +asterixis.    Intake/Output from previous day: 05/26 0701 - 05/27 0700 In: 360 [P.O.:360] Out: 252 [Urine:250; Stool:2] Intake/Output this shift:   BMET  Recent Labs  11/15/12 0800 11/16/12 0630 11/17/12 0612  NA 135 129* 132*  K 4.1 4.2 3.9  CL 104 99 99  CO2 28 23 25   GLUCOSE 93 106* 121*  BUN 12 11 11   CREATININE 0.58 0.55 0.58  CALCIUM 7.4* 7.9* 8.0*   LFT  Recent Labs  11/15/12 0800 11/17/12 0612  PROT 7.3 7.8  ALBUMIN 1.6* 1.8*  AST 136* 105*  ALT 41 42  ALKPHOS 155* 197*  BILITOT 6.7* 5.0*   PT/INR  Recent Labs  11/16/12 0630 11/17/12 0612  LABPROT 31.9* 31.5*  INR 3.33* 3.27*     Assessment: 57 year old male with alcoholic hepatitis, cirrhosis, and hepatic encephalopathy. Continued confusion but pleasant; likely multifactorial in the setting of hepatic decompensation and possible alcohol withdrawal. Needs titration of lactulose, as he has only had 2 BMs today. Appears Prednisolone 40 mg was originally ordered due to his discriminant factor of 84. However, this was discontinued and changed to  Prednisone for unknown reasons. Current DF is 80 (with a control factor of 15). Spoke with pharmacy, and we will change back to Prednisolone, which is a better option in this scenario.     Plan: Prednisolone 40 mg po daily X total of 28 days with subsequent taper Titrate Lactulose Repeat labs in am  Nira Retort, ANP-BC [facility]    LOS: 5 days    11/17/2012, 1645

## 2012-11-17 NOTE — Clinical Social Work Psychosocial (Signed)
    Clinical Social Work Department BRIEF PSYCHOSOCIAL ASSESSMENT 11/17/2012  Patient:  Austin Ruiz, Austin Ruiz     Account Number:  [id number]     Admit date:  11/12/2012  Clinical Social Worker:  Santa Genera, CLINICAL SOCIAL WORKER  Date/Time:  11/17/2012 11:00 AM  Referred by:  Physician  Date Referred:  11/16/2012 Referred for  Substance Abuse   Other Referral:   Interview type:  Patient Other interview type:    PSYCHOSOCIAL DATA Living Status:  FAMILY Admitted from facility:   Level of care:   Primary support name:  Delores Florance Primary support relationship to patient:  SPOUSE Degree of support available:   Adequate per patient report    CURRENT CONCERNS Current Concerns  Substance Abuse   Other Concerns:    SOCIAL WORK ASSESSMENT / PLAN CSW met w patient at bedside, patient alert and oriented. Knows year, approx date, knows he is in hospital.  States he lives w his family in [place].  Patient is disabled, last worked 9 years ago in [employer]. Originally from [place], has been in [place] many years.  Wife employed outside the home.  Patient has 3 children, ages 19, 25, 36, all of whom live in the home.    Patient admits to drinking 2 40 oz beers/day every day. Admits to occasional consumption of more than 80 oz if its a "social occasion" - says this occurs infrequently. Denies any use of other kinds of alcohol or drugs.  Says he does take Tylenol for headache.  Begins drinking at dinner time.  Says his wife and children have all asked him to stop drinking, has been abstinent before but has failed to stay sober for significant length of time.    Patient says that he wants to attend AA and realizes he needs help to achieve/maintain his sobriety.  Patient says that he will attend meetings in [place], has friends who also attend AA.  CSW affirmed patient desire to get help w stopping drinking, provided list of AA meetings in [place].  Also gave information on [employer] to  patient for family member support.    At this point, CSW will sign off as patient has plan to stop drinking and is aware of community resources to assist.   Assessment/plan status:  Information/Referral to [facility] Other assessment/ plan:   Information/referral to community resources:   AA list for [facility]  [place] information for family  Rethinking Drinking pamphlet    PATIENT'S/FAMILY'S RESPONSE TO PLAN OF CARE: Patient appreciative     Santa Genera, LCSW Clinical Social Worker [phone])

## 2012-11-17 NOTE — Progress Notes (Signed)
Physical Therapy Treatment Patient Details Name: Austin Ruiz MRN: [id number] DOB: [birth year] Today's Date: 11/17/2012 Time: 4098-1191 PT Time Calculation (min): 23 min  PT Assessment / Plan / Recommendation Comments on Treatment Session  Pt now alert and cooperative.  He received gait training with cues to slow his pace and try to center his focus.  Ataxia is much improved and he ambulated well for 100'.  At that point, I had him ascend/descend a flight of steps (he states that he has 1 flight of steps into his home).  He rapdily fatigued on steps and needed mod assist to maintain stability. Gait was then more ataxic and he needed assist from me to maintain stability.             Follow Up Recommendations        Does the patient have the potential to tolerate intense rehabilitation     Barriers to Discharge        Equipment Recommendations       Recommendations for Other Services    Frequency     Plan Discharge plan remains appropriate;Frequency remains appropriate    Precautions / Restrictions Restrictions Weight Bearing Restrictions: No   Pertinent Vitals/Pain     Mobility  Ambulation/Gait Ambulation/Gait Assistance: 4: Min guard Ambulation Distance (Feet): 200 Feet Assistive device: None Ambulation/Gait Assistance Details: gait is much more stable today with no ataxia until he tires.  Then the ataxia is mild and he needs min assist Gait Pattern: Ataxic (when tired) Gait velocity: WNL.Marland KitchenMarland Kitchenpt encouraged to keep a slower pace Stairs: Yes Stairs Assistance: 3: Mod assist Stairs Assistance Details (indicate cue type and reason): pt tries to walk alternating pattern on steps but was instructed to do step to step pattern...he began to tire after 8 steps and stability decreased and he needed my assistance to maintain balance...descending steps was mildly unstable using step to pattern Stair Management Technique: Step to pattern;One rail Right Number of Stairs: 12 Wheelchair  Mobility Wheelchair Mobility: No    Exercises     PT Diagnosis:    PT Problem List:   PT Treatment Interventions:     PT Goals Acute Rehab PT Goals PT Goal: Ambulate - Progress: Progressing toward goal PT Goal: Up/Down Stairs - Progress: Progressing toward goal  Visit Information  Last PT Received On: 11/17/12 Reason Eval/Treat Not Completed: Other (comment) (Unable to work with patient, ha has just been sedated due to)    Subjective Data  Subjective: pt has no c/o   Cognition       Balance     End of Session PT - End of Session Equipment Utilized During Treatment: Gait belt Activity Tolerance: Patient tolerated treatment well Patient left: in bed;with call bell/phone within reach;with bed alarm set (sitter present)   GP     Myrlene Broker L 11/17/2012, 11:05 AM

## 2012-11-18 LAB — URINE CULTURE

## 2012-11-18 LAB — HEPATIC FUNCTION PANEL
Indirect Bilirubin: 2.4 mg/dL — ABNORMAL HIGH (ref 0.3–0.9)
Total Protein: 6.8 g/dL (ref 6.0–8.3)

## 2012-11-18 LAB — CBC
HCT: 31.9 % — ABNORMAL LOW (ref 39.0–52.0)
Hemoglobin: 11.5 g/dL — ABNORMAL LOW (ref 13.0–17.0)
RDW: 16.3 % — ABNORMAL HIGH (ref 11.5–15.5)
WBC: 9.3 10*3/uL (ref 4.0–10.5)

## 2012-11-18 LAB — PROTIME-INR: INR: 3.47 — ABNORMAL HIGH (ref 0.00–1.49)

## 2012-11-18 MED ORDER — LACTULOSE 10 GM/15ML PO SOLN: 20.0000 g | Freq: Three times a day (TID) | ORAL | Status: DC

## 2012-11-18 MED ORDER — LACTULOSE 10 GM/15ML PO SOLN
30.0000 g | Freq: Three times a day (TID) | ORAL | Status: DC
  Administered 2012-11-18: 30 g via ORAL
  Filled 2012-11-18: qty 60

## 2012-11-18 MED ORDER — FOLIC ACID 1 MG PO TABS: 1.0000 mg | ORAL_TABLET | Freq: Every day | ORAL | Status: DC

## 2012-11-18 MED ORDER — PREDNISONE 20 MG PO TABS: 40.0000 mg | ORAL_TABLET | Freq: Every day | ORAL | Status: DC

## 2012-11-18 MED ORDER — THIAMINE HCL 100 MG PO TABS: 100.0000 mg | ORAL_TABLET | Freq: Every day | ORAL | Status: AC

## 2012-11-18 NOTE — Clinical Social Work Note (Signed)
CSW gave patient Spanish translation on Rethinking Drinking pamphlet, reviewed key points on consumption level.  Santa Genera, LCSW Clinical Social Worker [phone])

## 2012-11-18 NOTE — Progress Notes (Signed)
Physical Therapy Treatment Patient Details Name: Austin Ruiz MRN: [id number] DOB: 10-May-1956 Today's Date: 11/18/2012 Time: 1191-4782 PT Time Calculation (min): 36 min  PT Assessment / Plan / Recommendation Comments on Treatment Session  Pt very alert, coorperative and motivated to complete physical therapy today.  Pt with improved stability and improving activity tolerance.  Able to complete stair training with reciprocal pattern ascending and descending pattern with no LOB episodes.  Min guard required with obstacles placed during gait training, no ataxia noted through session.  Pt left in bed with bed alarm set, call bell within reach and nurses in room.      Follow Up Recommendations        Does the patient have the potential to tolerate intense rehabilitation     Barriers to Discharge        Equipment Recommendations       Recommendations for Other Services    Frequency     Plan      Precautions / Restrictions Precautions Precautions: Fall Restrictions Weight Bearing Restrictions: No    Mobility  Bed Mobility Supine to Sit: 7: Independent Sit to Supine: 7: Independent Transfers Sit to Stand: 5: Supervision;Without upper extremity assist Stand to Sit: 7: Independent Ambulation/Gait Ambulation/Gait Assistance: 4: Min guard Ambulation Distance (Feet): 500 Feet Assistive device: None Ambulation/Gait Assistance Details: stable gait, no ataxia noted.  Min guard with obstabcles Gait Pattern: Within Functional Limits Stairs: Yes Stairs Assistance: 4: Min assist Stairs Assistance Details (indicate cue type and reason): pt able to perform reciprocal pattern with stairs ascending and descending with no LOB episodes, walked for 300 feet following stairs training with no LOB episodes noted. Stair Management Technique: Alternating pattern;One rail Left Number of Stairs: 12    Exercises     PT Diagnosis:    PT Problem List:   PT Treatment Interventions:     PT  Goals Acute Rehab PT Goals PT Goal: Ambulate - Progress: Met PT Goal: Up/Down Stairs - Progress: Met  Visit Information  Last PT Received On: 11/18/12    Subjective Data  Subjective: No c/o   Cognition  Cognition Arousal/Alertness: Awake/alert Behavior During Therapy: WFL for tasks assessed/performed Overall Cognitive Status: Within Functional Limits for tasks assessed    Balance     End of Session PT - End of Session Equipment Utilized During Treatment: Gait belt Activity Tolerance: Patient tolerated treatment well Patient left: in bed;with call bell/phone within reach;with bed alarm set;with nursing in room Nurse Communication: Mobility status   GP     Juel Burrow 11/18/2012, 9:33 AM

## 2012-11-18 NOTE — Discharge Summary (Signed)
Physician Discharge Summary  Austin Ruiz:[id number] DOB: 1956-04-11 DOA: 11/12/2012  PCP: Estanislado Pandy, MD  Admit date: 11/12/2012 Discharge date: 11/18/2012  Time spent: Greater than 30 minutes  Recommendations for Outpatient Follow-up:  1. Follow with gastroenterology, Dr. Kendell Bane in 2 weeks' time.  Discharge Diagnoses:  1. Alcoholic Hepatic encephalopathy, improved. 2. Alcohol withdrawal, improved. 3. 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 3. Alcoholic hepatitis.   Discharge Condition: Stable.  Diet recommendation: No alcohol. Regular.  Filed Weights   11/16/12 0500 11/17/12 0500 11/18/12 0518  Weight: 73.1 kg (161 lb 2.5 oz) 71.2 kg (156 lb 15.5 oz) 72.3 kg (159 lb 6.3 oz)    History of present illness:  This 57 year old man who has a history of repeated admissions secondary to alcohol-related disease presents once again with symptoms of vomiting and confusion. Please see initial history as outlined below: HPI: Austin Ruiz is a 57 y.o. male with past medical history alcoholic cirrhosis who continues to drink alcohol. The patient presented to his gastroenterologists office today for evaluation. His wife reports that he been increasingly confused over the past 2 days. He reported a history of persistent vomiting. He was sent to the ER for further evaluation. The patient reports that his vomiting and diarrhea started approximately 3 days ago. He's unable to keep much food down. His wife reports that he has been taking his lactulose, although he has not been taking it 3 times a day. He usually takes it once or twice a day at most. He hasn't taken the remainder of his medications. Although his abdomen is distended, they feel this distention has improved over the past few weeks. He denies any hematochezia, melena, hematemesis. He denies any history of fever, although he was found to have a low-grade temperature in the emergency room. He describes diffuse abdominal pain. Denies any cough  or shortness of breath. Denies any sick contacts. In the emergency room he was found to have an elevated ammonia and clinical signs of hepatic encephalopathy. He's been referred for admission  Hospital Course:  The patient was admitted and started on intravenous fluids, lactulose, protocol for alcohol withdrawal. He was started on oral prednisone for his alcoholic hepatitis. Unfortunately his INR was elevated above 3 and continues to be so. Unfortunately, he has not quit drinking alcohol and does not appear to have any intention of doing so. He was seen by gastroenterology, Dr. Kendell Bane who made above recommendations. Since admission, he is somewhat stabilized and is more alert today. He is now stable for discharge with close followup with gastroenterology soon. He will need to have prednisone 40 mg daily for 28 days and then start to taper.  Procedures:  None.   Consultations:  Gastroenterology, Dr. Kendell Bane.  Discharge Exam: Filed Vitals:   11/16/12 2100 11/17/12 0500 11/17/12 2316 11/18/12 0518  BP: 132/82 131/60 144/81 115/76  Pulse: 75 73 75 73  Temp: 97.8 F (36.6 C) 98.1 F (36.7 C) 97.4 F (36.3 C) 97.1 F (36.2 C)  TempSrc: Oral Oral Oral Axillary  Resp: 18 18 18 17   Height:      Weight:  71.2 kg (156 lb 15.5 oz)  72.3 kg (159 lb 6.3 oz)  SpO2: 100%  98% 96%    General: He looks chronically sick. He is somewhat jaundiced. His bilirubin next he has improved over the last couple of days. Cardiovascular: Heart sounds are present without any murmurs or sounds. Respiratory: Lung fields are clear. Abdomen is soft and nontender. He is alert  today more so than yesterday, no focal neurological signs.  Discharge Instructions  Discharge Orders   Future Orders Complete By Expires     Diet - low sodium heart healthy  As directed     Increase activity slowly  As directed         Medication List    STOP taking these medications       acetaminophen 500 MG tablet  Commonly known  as:  TYLENOL      TAKE these medications       folic acid 1 MG tablet  Commonly known as:  FOLVITE  Take 1 tablet (1 mg total) by mouth daily.     furosemide 40 MG tablet  Commonly known as:  LASIX  Take 40 mg by mouth every morning.     lactulose 10 GM/15ML solution  Commonly known as:  CHRONULAC  Take 30 mLs (20 g total) by mouth 3 (three) times daily.     pantoprazole 40 MG tablet  Commonly known as:  PROTONIX  Take 40 mg by mouth every morning.     predniSONE 20 MG tablet  Commonly known as:  DELTASONE  Take 2 tablets (40 mg total) by mouth daily.     spironolactone 100 MG tablet  Commonly known as:  ALDACTONE  Take 100 mg by mouth every morning.     thiamine 100 MG tablet  Take 1 tablet (100 mg total) by mouth daily.     traZODone 100 MG tablet  Commonly known as:  DESYREL  Take 100 mg by mouth at bedtime as needed for sleep.       No Known Allergies    The results of significant diagnostics from this hospitalization (including imaging, microbiology, ancillary and laboratory) are listed below for reference.    Significant Diagnostic Studies: Portable Chest 1 View  11/12/2012   *RADIOLOGY REPORT*  Clinical Data: Fever, weakness, history of alcoholic cirrhosis  PORTABLE CHEST - 1 VIEW  Comparison: Portable exam 2039 hours compared to 09/09/2012  Findings: Enlargement of cardiac silhouette. Slight pulmonary vascular congestion. Minimal elongation of thoracic aorta. Elevation of right diaphragm with right basilar atelectasis. Lungs otherwise clear. No pleural effusion or pneumothorax.  IMPRESSION: Enlargement of cardiac silhouette with pulmonary vascular congestion. Minimal right basilar atelectasis.   Original Report Authenticated By: Ulyses Southward, M.D.    Microbiology: Recent Results (from the past 240 hour(s))  URINE CULTURE     Status: None   Collection Time    11/13/12 11:20 AM      Result Value Range Status   Specimen Description URINE, RANDOM   Final    Special Requests NONE   Final   Culture  Setup Time 11/14/2012 01:52   Final   Colony Count 20,OOO COLONIES/ML   Final   Culture     Final   Value: STAPHYLOCOCCUS SPECIES (COAGULASE NEGATIVE)     Note: RIFAMPIN AND GENTAMICIN SHOULD NOT BE USED AS SINGLE DRUGS FOR TREATMENT OF STAPH INFECTIONS.   Report Status PENDING   Incomplete  CLOSTRIDIUM DIFFICILE BY PCR     Status: None   Collection Time    11/13/12  2:30 PM      Result Value Range Status   C difficile by pcr NEGATIVE  NEGATIVE Final     Labs: Basic Metabolic Panel:  Recent Labs Lab 11/13/12 0639 11/14/12 0640 11/15/12 0800 11/16/12 0630 11/17/12 0612  NA 132* 133* 135 129* 132*  K 3.4* 3.2* 4.1 4.2 3.9  CL 97  97 104 99 99  CO2 27 27 28 23 25   GLUCOSE 99 113* 93 106* 121*  BUN 10 12 12 11 11   CREATININE 0.60 0.67 0.58 0.55 0.58  CALCIUM 7.9* 7.9* 7.4* 7.9* 8.0*   Liver Function Tests:  Recent Labs Lab 11/13/12 0639 11/14/12 0640 11/15/12 0800 11/17/12 0612 11/18/12 0611  AST 81* 85* 136* 105* 86*  ALT 23 25 41 42 37  ALKPHOS 174* 178* 155* 197* 189*  BILITOT 5.8* 5.3* 6.7* 5.0* 4.5*  PROT 8.2 7.3 7.3 7.8 6.8  ALBUMIN 1.9* 1.7* 1.6* 1.8* 1.6*    Recent Labs Lab 11/12/12 1612  LIPASE 57    Recent Labs Lab 11/12/12 1616 11/13/12 0640  AMMONIA 79* 75*   CBC:  Recent Labs Lab 11/12/12 1612 11/13/12 0639 11/14/12 0640 11/18/12 0611  WBC 4.1 5.0 8.5 9.3  NEUTROABS 2.9  --   --   --   HGB 11.8* 12.5* 11.8* 11.5*  HCT 33.5* 35.1* 33.2* 31.9*  MCV 98.5 98.6 97.6 98.5  PLT 67* 75* 85* 109*        Signed:  GOSRANI,NIMISH C  [facility] 11/18/2012, 10:07 AM

## 2012-11-18 NOTE — Progress Notes (Signed)
Subjective: Per nursing, patient was restless overnight with dosing of lactulose. Walked the halls, with multiple BMs while walking the hall. Lactulose increased yesterday evening. Patient alert to person, states he is here "because I am sick". Confused regarding year. Quite drowsy but easily awakens.   Objective: Vital signs in last 24 hours: Temp:  [97.1 F (36.2 C)-97.4 F (36.3 C)] 97.1 F (36.2 C) (05/28 0518) Pulse Rate:  [73-75] 73 (05/28 0518) Resp:  [17-18] 17 (05/28 0518) BP: (115-144)/(76-81) 115/76 mmHg (05/28 0518) SpO2:  [96 %-98 %] 96 % (05/28 0518) Weight:  [159 lb 6.3 oz (72.3 kg)] 159 lb 6.3 oz (72.3 kg) (05/28 0518) Last BM Date: 11/17/12 General:   Drowsy but awakens to name.  Head:  Normocephalic and atraumatic. Eyes:  +icterus Heart:  S1, S2 present, no murmurs noted.  Lungs: Clear to auscultation bilaterally, without wheezing, rales, or rhonchi.  Abdomen:  Bowel sounds present, soft, non-tender, non-distended.  Msk:  Symmetrical without gross deformities. Normal posture. Extremities:  Without clubbing or edema. Neurologic:  Oriented to person, place, knows he is "sick". +asterixis.    Intake/Output from previous day: 05/27 0701 - 05/28 0700 In: 580 [P.O.:580] Out: -  Intake/Output this shift:   BMET  Recent Labs  11/16/12 0630 11/17/12 0612  NA 129* 132*  K 4.2 3.9  CL 99 99  CO2 23 25  GLUCOSE 106* 121*  BUN 11 11  CREATININE 0.55 0.58  CALCIUM 7.9* 8.0*   LFT  Recent Labs  11/17/12 0612  PROT 7.8  ALBUMIN 1.8*  AST 105*  ALT 42  ALKPHOS 197*  BILITOT 5.0*   PT/INR  Recent Labs  11/17/12 0612 11/18/12 0611  LABPROT 31.5* 32.9*  INR 3.27* 3.47*    Assessment: 57 year old male with alcoholic hepatitis, cirrhosis, and hepatic encephalopathy. Although drowsy at time of visit, he seemed more aware of his situation, stating he was "sick" and confirming he is in [place]. This is a slight improvement from yesterday. Lactulose  increased to 45 ml QID yesterday evening, which could be reduced to TID due to his response. Continue Prednisolone. Hopeful d/c in near future.    Plan: Prednisolone 40 mg po daily for total of 28 days with subsequent taper Lactulose decreased to TID Repeat HFP, CBC  this am  Nira Retort, ANP-BC [facility]    LOS: 6 days    11/18/2012, 8:40 AM

## 2012-11-18 NOTE — Progress Notes (Signed)
Pt wife verbalizes understanding of d/c instructions, medications and follow up care (they will call [facility] to set up an appt for whenever Rourk and/or Tobi Bastos would like to see him). They received necessary prescriptions. IV d/c without complications. No questions at this time. I escorted pt to the main entrance via wheelchair, accompanied by his wife, who will be driving him home. Sheryn Bison

## 2012-11-26 ENCOUNTER — Ambulatory Visit (INDEPENDENT_AMBULATORY_CARE_PROVIDER_SITE_OTHER): Payer: BLUE CROSS/BLUE SHIELD | Admitting: Gastroenterology

## 2012-11-26 ENCOUNTER — Other Ambulatory Visit: Payer: Self-pay | Admitting: Gastroenterology

## 2012-11-26 ENCOUNTER — Encounter: Payer: Self-pay | Admitting: Gastroenterology

## 2012-11-26 VITALS — BP 137/69 | HR 65 | Temp 98.4°F | Ht 63.0 in | Wt 176.8 lb

## 2012-11-26 DIAGNOSIS — F1010 Alcohol abuse, uncomplicated: Secondary | ICD-10-CM

## 2012-11-26 DIAGNOSIS — K7010 Alcoholic hepatitis without ascites: Secondary | ICD-10-CM

## 2012-11-26 DIAGNOSIS — R609 Edema, unspecified: Secondary | ICD-10-CM

## 2012-11-26 DIAGNOSIS — R601 Generalized edema: Secondary | ICD-10-CM

## 2012-11-26 DIAGNOSIS — K7030 Alcoholic cirrhosis of liver without ascites: Secondary | ICD-10-CM

## 2012-11-26 LAB — CBC WITH DIFFERENTIAL/PLATELET
Basophils Absolute: 0 10*3/uL (ref 0.0–0.1)
Basophils Relative: 0 % (ref 0–1)
Eosinophils Absolute: 0.2 10*3/uL (ref 0.0–0.7)
Eosinophils Relative: 2 % (ref 0–5)
Lymphocytes Relative: 20 % (ref 12–46)
MCH: 35.2 pg — ABNORMAL HIGH (ref 26.0–34.0)
MCHC: 36.2 g/dL — ABNORMAL HIGH (ref 30.0–36.0)
MCV: 97.4 fL (ref 78.0–100.0)
Platelets: 122 10*3/uL — ABNORMAL LOW (ref 150–400)
RDW: 17.5 % — ABNORMAL HIGH (ref 11.5–15.5)
WBC: 9.8 10*3/uL (ref 4.0–10.5)

## 2012-11-26 LAB — COMPREHENSIVE METABOLIC PANEL
ALT: 36 U/L (ref 0–53)
AST: 72 U/L — ABNORMAL HIGH (ref 0–37)
CO2: 29 mEq/L (ref 19–32)
Chloride: 92 mEq/L — ABNORMAL LOW (ref 96–112)
Sodium: 127 mEq/L — ABNORMAL LOW (ref 135–145)
Total Bilirubin: 7.4 mg/dL — ABNORMAL HIGH (ref 0.3–1.2)
Total Protein: 7.6 g/dL (ref 6.0–8.3)

## 2012-11-26 LAB — PROTIME-INR: INR: 2.65 — ABNORMAL HIGH (ref <1.50)

## 2012-11-26 MED ORDER — FUROSEMIDE 20 MG PO TABS: 20.0000 mg | ORAL_TABLET | Freq: Every evening | ORAL | Status: DC

## 2012-11-26 MED ORDER — SPIRONOLACTONE 50 MG PO TABS: 50.0000 mg | ORAL_TABLET | Freq: Every evening | ORAL | Status: DC

## 2012-11-26 NOTE — Progress Notes (Signed)
Primary Care Physician: Estanislado Pandy, MD  Primary Gastroenterologist:  Roetta Sessions, MD   Chief Complaint  Patient presents with  . Edema    HPI: Austin Ruiz is a 57 y.o. male here as urgent visit for anasarca. Discharged last week after hospitalization for hepatic encephalopathy. At time of discharge she weighed 159 pounds. He weighs 176.8 pounds today. He was started on prednisolone during his hospital stay for alcoholic hepatitis. When he was discharged, he went home on prednisone 40 mg daily he denies any fever. He has had some minimal confusion such as not recalling the day of the week. No tremors. He started to accumulate fluid in his lower extremities the day after his discharge. Notes his abdomen is somewhat more swollen but denies any associated pain or shortness of breath. Denies swelling of his genitalia. States seizure naming frequently. Appetite fine. Denies vomiting, heartburn, dysphagia, melena, rectal bleeding. Has about 4 stools daily. Sometimes has fecal incontinence related to urgency. Taking lactulose 40 cc twice daily according to the wife. No prior abdominal paracentesis. Continues to consume 80 ounces of beer daily.  Current Outpatient Prescriptions  Medication Sig Dispense Refill  . folic acid (FOLVITE) 1 MG tablet Take 1 tablet (1 mg total) by mouth daily.  30 tablet  0  . furosemide (LASIX) 40 MG tablet Take 40 mg by mouth every morning.      . lactulose (CHRONULAC) 10 GM/15ML solution Take 30 mLs (20 g total) by mouth 3 (three) times daily.  1892 mL  0  . pantoprazole (PROTONIX) 40 MG tablet Take 40 mg by mouth every morning.      . predniSONE (DELTASONE) 20 MG tablet Take 2 tablets (40 mg total) by mouth daily.  40 tablet  0  . spironolactone (ALDACTONE) 100 MG tablet Take 100 mg by mouth every morning.       . thiamine 100 MG tablet Take 1 tablet (100 mg total) by mouth daily.  30 tablet  0  . traZODone (DESYREL) 100 MG tablet Take 100 mg by mouth at bedtime as  needed for sleep.        No current facility-administered medications for this visit.    Allergies as of 11/26/2012  . (No Known Allergies)    ROS:  General: Negative for anorexia, weight loss, fever, chills, fatigue, weakness. ENT: Negative for hoarseness, difficulty swallowing , nasal congestion. CV: Negative for chest pain, angina, palpitations, dyspnea on exertion, peripheral edema.  Respiratory: Negative for dyspnea at rest, dyspnea on exertion, cough, sputum, wheezing.  GI: See history of present illness. GU:  Negative for dysuria, hematuria, urinary incontinence, urinary frequency, nocturnal urination.  Endo: Negative for unusual weight change. See history of present illness   Physical Examination:   BP 137/69  Pulse 65  Temp(Src) 98.4 F (36.9 C) (Oral)  Ht 5\' 3"  (1.6 m)  Wt 176 lb 12.8 oz (80.196 kg)  BMI 31.33 kg/m2  General: Chronically ill-appearing Hispanic male, English speaking in no acute distress. Accompanied by wife Eyes: Positive icterus. Mouth: Oropharyngeal mucosa moist and pink , no lesions erythema or exudate. Lungs: Clear to auscultation bilaterally.  Heart: Regular rate and rhythm, no murmurs rubs or gallops.  Abdomen: Bowel sounds are normal, nontender, no hepatomegaly or masses, no abdominal bruits or hernia , no rebound or guarding.  2+ pitting edema in the dependent regions (flanks). Nontender. Positive fluid wave. Extremities: 3-4+ pitting edema to the knees bilaterally. No clubbing or deformities. Neuro: Alert and oriented x 4.  No asterixis   Skin: Warm and dry, no jaundice.   Psych: Alert and cooperative, normal mood and affect.  Labs:  Lab Results  Component Value Date   INR 3.47* 11/18/2012   INR 3.27* 11/17/2012   INR 3.33* 11/16/2012   Lab Results  Component Value Date   ALT 37 11/18/2012   AST 86* 11/18/2012   ALKPHOS 189* 11/18/2012   BILITOT 4.5* 11/18/2012   Lab Results  Component Value Date   WBC 9.3 11/18/2012   HGB 11.5*  11/18/2012   HCT 31.9* 11/18/2012   MCV 98.5 11/18/2012   PLT 109* 11/18/2012   Lab Results  Component Value Date   CREATININE 0.58 11/17/2012   BUN 11 11/17/2012   NA 132* 11/17/2012   K 3.9 11/17/2012   CL 99 11/17/2012   CO2 25 11/17/2012     Imaging Studies: Portable Chest 1 View  11/12/2012   *RADIOLOGY REPORT*  Clinical Data: Fever, weakness, history of alcoholic cirrhosis  PORTABLE CHEST - 1 VIEW  Comparison: Portable exam 2039 hours compared to 09/09/2012  Findings: Enlargement of cardiac silhouette. Slight pulmonary vascular congestion. Minimal elongation of thoracic aorta. Elevation of right diaphragm with right basilar atelectasis. Lungs otherwise clear. No pleural effusion or pneumothorax.  IMPRESSION: Enlargement of cardiac silhouette with pulmonary vascular congestion. Minimal right basilar atelectasis.   Original Report Authenticated By: Ulyses Southward, M.D.

## 2012-11-26 NOTE — Progress Notes (Signed)
Quick Note:  Discussed with Dr. Jena Gauss. Prednisone likely causing more harm than good with regards to swelling especially in light of ongoing etoh abuse. No significant improvement in LFTs and actually bilirubin is higher. INR pending.  Start prednisone taper, 30mg  daily for 5 days, then 20mg  daily for 5 days, then 10mg  daily for 5 days then stop. 2 gram sodium diet. 1.5 liter fluid restriction. Change lasix to 40mg  in am and 20mg  in pm. Change aldactone to 100mg  in am and 50mg  in pm. Recheck CMET in 2 weeks.  OV in 2 weeks.   Discussed at length with wife and patient (on phone for 20 minutes). Discussed that patient is going to die from his drinking if he doesn't stop. They are looking into inpatient rehab in [place] but patient has to call and make appointment per wife. Apparently there is a wait list of 6 weeks. Offered any assistance if they need it. She will let us know. RX for lasix 20mg  and aldactone 50mg  sent to [facility] in [place]. They are to call if edema does not improve.  ______

## 2012-11-26 NOTE — Progress Notes (Signed)
CC PCP 

## 2012-11-26 NOTE — Assessment & Plan Note (Addendum)
57 year old Hispanic gentleman with decompensated alcoholic cirrhosis, ongoing alcohol abuse, on prednisone for alcoholic hepatitis who presents with acute onset lower extremity edema, increased ascites. On Lasix 40 mg daily, Aldactone 100 mg daily. Dietary recall ever last 24 hours includes hot dog and chips although his wife states he rarely eats that. I suspect anasarca multifactorial related to significant sodium intake and along with decompensated cirrhosis, prednisone, alcoholic hepatitis. He reports good urinary output. History of previous portal vein thrombosis. Most recent imaging via MRI in April 2014 showed diminutive main portal vein.   1. Stat labs including CBC, CMET, INR. 2. Additional 50mg  dose of aldactone tonight if we do not have labs back in time. 3. 2g sodium diet, discussed at length with patient and wife.  4. Cut back on etoh with goal of cessation. Unfortunately patient has not been able to stop long-term despite inpatient rehab. 5. Further recommendations to follow.

## 2012-11-26 NOTE — Patient Instructions (Addendum)
1. Please go straight to the lab at [facility] to have your blood work done. We will call you with further recommendations as soon as we get those results. 2. Take an additional 50 mg of Aldactone tonight. 3. You need to cut back on your alcohol consumption right away so your liver can have a chance to function.

## 2012-11-27 ENCOUNTER — Other Ambulatory Visit: Payer: Self-pay

## 2012-11-27 ENCOUNTER — Other Ambulatory Visit: Payer: Self-pay | Admitting: Gastroenterology

## 2012-11-27 DIAGNOSIS — K7460 Unspecified cirrhosis of liver: Secondary | ICD-10-CM

## 2012-11-27 DIAGNOSIS — R601 Generalized edema: Secondary | ICD-10-CM

## 2012-11-27 NOTE — Progress Notes (Signed)
Quick Note:  Lab order is done and mailed to pt. Darl Pikes, please schedule ov ______

## 2012-12-02 ENCOUNTER — Ambulatory Visit: Payer: Self-pay | Admitting: Gastroenterology

## 2012-12-07 ENCOUNTER — Telehealth: Payer: Self-pay

## 2012-12-07 NOTE — Telephone Encounter (Signed)
I need current weight on him. He can weigh at home if scales available.  Is he still drinking?  Verify dose of diuretics he is taking?  Any difficulty breathing?  Any difficulty urinating?

## 2012-12-07 NOTE — Telephone Encounter (Signed)
Pt's wife called this morning because he has pockets of fluid on his sides that a weeping. She does not know what to do. He is taking his fluid pills. Please advise

## 2012-12-07 NOTE — Telephone Encounter (Signed)
Pt's wife is aware to go have blood work drawn. She is going to take him to [facility] to have them drawn. I am going to fax over the order. I also told her to take him to the ER if he has trouble breathing,fever,decreased urinary out-pt and weakness.

## 2012-12-07 NOTE — Telephone Encounter (Signed)
His weight is 170 lb. He is not having any trouble breathing and he is urinating good. He take lasix 40 mg in the am and 20 mg in the pm. Aldactone 100 mg in the am and 50 mg in the pm

## 2012-12-07 NOTE — Telephone Encounter (Signed)
Let's go ahead and repeat his CMET now so we can see what liver is doing and if we can increase diuretics again.  He needs to stop drinking. I suspect he is weeping fluid due to low albumin. No easy fix, he needs to give his liver a break so there can be some recovery of function.   He should go to ER if he develops difficulty breathing, fever, decreased urinary output, increased weakness.

## 2012-12-08 NOTE — Telephone Encounter (Signed)
Please f/u on results from [facility] today.

## 2012-12-08 NOTE — Telephone Encounter (Signed)
I received the labs from [facility]. His   Calcium-7.8 Total protein-6.9 Albumin-1.8 ALK Phos-265 AST-55 Total Bilirubin-7.1 ALT-35 Sodium-121 Potassium-4.7 Chloride-95 CO2-29.0 Anion GAP-9.7 BUN-17 Creatinine-0.78

## 2012-12-09 NOTE — Telephone Encounter (Addendum)
1. Let's change his lasix to 60mg  AM only. No PM dose. 2. Increase aldactone to 100mg  BID. 3. Check CMET on 12/15/12. 4. Keep appt on 12/16/12. 5. Go to ER if swelling does not improve, increased drowsiness, fever, SOB, decreased urinary output. 6. Continue to limit fluid and salt as before. 7. No etoh.

## 2012-12-09 NOTE — Telephone Encounter (Signed)
Tried to call but the phone was not working

## 2012-12-10 NOTE — Telephone Encounter (Signed)
Wife is aware of what to do

## 2012-12-10 NOTE — Telephone Encounter (Signed)
Left message with daughter to have mother call me back

## 2012-12-16 ENCOUNTER — Ambulatory Visit: Payer: Self-pay | Admitting: Gastroenterology

## 2012-12-16 ENCOUNTER — Encounter: Payer: Self-pay | Admitting: Gastroenterology

## 2012-12-16 ENCOUNTER — Ambulatory Visit (INDEPENDENT_AMBULATORY_CARE_PROVIDER_SITE_OTHER): Payer: BLUE CROSS/BLUE SHIELD | Admitting: Gastroenterology

## 2012-12-16 ENCOUNTER — Other Ambulatory Visit: Payer: Self-pay

## 2012-12-16 VITALS — BP 146/74 | HR 83 | Temp 99.9°F | Ht 64.0 in | Wt 172.8 lb

## 2012-12-16 DIAGNOSIS — K7030 Alcoholic cirrhosis of liver without ascites: Secondary | ICD-10-CM

## 2012-12-16 LAB — COMPREHENSIVE METABOLIC PANEL
AST: 59 U/L — ABNORMAL HIGH (ref 0–37)
Albumin: 2 g/dL — ABNORMAL LOW (ref 3.5–5.2)
Alkaline Phosphatase: 214 U/L — ABNORMAL HIGH (ref 39–117)
BUN: 13 mg/dL (ref 6–23)
Calcium: 7.7 mg/dL — ABNORMAL LOW (ref 8.4–10.5)
Creat: 0.66 mg/dL (ref 0.50–1.35)
Total Bilirubin: 7.9 mg/dL — ABNORMAL HIGH (ref 0.3–1.2)
Total Protein: 7.1 g/dL (ref 6.0–8.3)

## 2012-12-16 NOTE — Patient Instructions (Addendum)
Stay on current dosing of diuretics.  Contact us if your weight goes up at all. Weigh yourself every day.   Do not drink more than 2 liters of fluid per day. Do not add any salt to your food.   We will see you back in 2 weeks. Seek medical attention if you have fever, chills, tight belly, nausea, vomiting.
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
## 2012-12-16 NOTE — Assessment & Plan Note (Signed)
57 year old male with decompensated ETOH cirrhosis due to non-compliance with ETOH cessation. Lasix 60 mg each morning with Aldactone 100 BID. He does note abstaining from ETOH for the past few days; I would like to see how he responds to continued abstinence prior to further adjusting of diuretics. Sodium on lower end but renal function remains good. Discussed tight fluid restriction, low sodium diet and the risk of death if he continues to drink as he has been doing. I spent about 15 minutes with the patient in the room looking for the rehabilitation center online, hoping to have him call asking for treatment while he was in our office. I was unable to find this, but he states he will call this evening. Wife present for entire visit. Will have him return in 2 weeks, recheck BMP in 1 week.

## 2012-12-17 ENCOUNTER — Telehealth: Payer: Self-pay | Admitting: Internal Medicine

## 2012-12-17 ENCOUNTER — Other Ambulatory Visit: Payer: Self-pay

## 2012-12-17 ENCOUNTER — Other Ambulatory Visit: Payer: Self-pay | Admitting: Gastroenterology

## 2012-12-17 DIAGNOSIS — Z139 Encounter for screening, unspecified: Secondary | ICD-10-CM

## 2012-12-17 NOTE — Telephone Encounter (Signed)
From what I understand, the patient has to call to set this up. I'm glad we have the contact information now. Let's arrange for HIV test so patient can proceed with rehab.

## 2012-12-17 NOTE — Progress Notes (Signed)
Cc PCP 

## 2012-12-17 NOTE — Telephone Encounter (Signed)
Pt's wife called this afternoon. She said that they were looking for a rehab center for patient, but she was told that patient would need to be set up for HIV test. Wife gave me the name of the facility and the number for AS to call since she was aware of what they were wanting to do from yesterday's OV. [facility] in [place], [place] [phone]

## 2012-12-17 NOTE — Telephone Encounter (Signed)
Called [facility] lab to find out which test will give you a positive/negative results. Lab order done and faxed to the lab. Called pt- LMOM that they can go to the lab and have test done. Asked them to call us if they had any questions.

## 2012-12-23 ENCOUNTER — Ambulatory Visit: Payer: Self-pay | Admitting: Gastroenterology

## 2012-12-23 LAB — HIV ANTIBODY (ROUTINE TESTING W REFLEX): HIV: NONREACTIVE

## 2012-12-31 NOTE — Progress Notes (Signed)
Quick Note:  Non-reactive test for HIV. Please let patient and wife know. Needs to be faxed to facility for alcohol rehab ______

## 2013-01-05 ENCOUNTER — Ambulatory Visit (INDEPENDENT_AMBULATORY_CARE_PROVIDER_SITE_OTHER): Payer: BLUE CROSS/BLUE SHIELD | Admitting: Gastroenterology

## 2013-01-05 VITALS — BP 146/79 | HR 73 | Temp 97.2°F | Ht 61.0 in | Wt 152.8 lb

## 2013-01-05 DIAGNOSIS — K7030 Alcoholic cirrhosis of liver without ascites: Secondary | ICD-10-CM

## 2013-01-05 NOTE — Progress Notes (Signed)
Cc PCP 
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
## 2013-01-05 NOTE — Patient Instructions (Addendum)
Please complete the blood work today. We will call you with the results and any changes to your medications.   We will see you in 3 months. I strongly advise that you apply for the treatment program in [place]. I am very proud of the progress you have made, and I want you to continue on this path!  Please weigh yourself daily. Let us know if you start gaining weight.

## 2013-01-05 NOTE — Assessment & Plan Note (Signed)
Down 20 lbs since June 25th on diuretic therapy of Lasix 40 mg each morning, Aldactone 100 mg each morning, with Lasix 20 and Aldactone 50 in afternoon. Resumed beer once per day per his report. Not interested in rehab currently. Discussed importance of complete cessation at time of visit. Vague reports of feeling fatigued. Needs updated HFP and BMP. Adjustment of diuretics pending review of blood work. Return in 3 months.

## 2013-01-06 LAB — HEPATIC FUNCTION PANEL
Albumin: 2.1 g/dL — ABNORMAL LOW (ref 3.5–5.2)
Alkaline Phosphatase: 232 U/L — ABNORMAL HIGH (ref 39–117)
Bilirubin, Direct: 3.2 mg/dL — ABNORMAL HIGH (ref 0.0–0.3)
Indirect Bilirubin: 3.7 mg/dL — ABNORMAL HIGH (ref 0.0–0.9)
Total Bilirubin: 6.9 mg/dL — ABNORMAL HIGH (ref 0.3–1.2)

## 2013-01-06 LAB — BASIC METABOLIC PANEL
Calcium: 7.4 mg/dL — ABNORMAL LOW (ref 8.4–10.5)
Glucose, Bld: 106 mg/dL — ABNORMAL HIGH (ref 70–99)
Sodium: 127 mEq/L — ABNORMAL LOW (ref 135–145)

## 2013-01-12 NOTE — Progress Notes (Signed)
Quick Note:  Tbili improving.  BUN, Cr remain normal.  Let's just TRY Lasix 40 mg each morning and Aldactone 100 mg each morning (this means he cuts out the afternoon doses of diuretics). He needs to call if there is any edema with these changes. ______

## 2013-01-15 ENCOUNTER — Encounter: Payer: Self-pay | Admitting: Gastroenterology

## 2013-04-07 ENCOUNTER — Ambulatory Visit: Payer: BLUE CROSS/BLUE SHIELD | Admitting: Gastroenterology

## 2013-04-07 ENCOUNTER — Encounter (INDEPENDENT_AMBULATORY_CARE_PROVIDER_SITE_OTHER): Payer: Self-pay

## 2013-04-07 ENCOUNTER — Encounter: Payer: Self-pay | Admitting: Gastroenterology

## 2013-04-07 ENCOUNTER — Ambulatory Visit (INDEPENDENT_AMBULATORY_CARE_PROVIDER_SITE_OTHER): Payer: BLUE CROSS/BLUE SHIELD | Admitting: Gastroenterology

## 2013-04-07 VITALS — BP 142/71 | HR 68 | Temp 96.8°F | Ht 63.0 in | Wt 179.8 lb

## 2013-04-07 DIAGNOSIS — R609 Edema, unspecified: Secondary | ICD-10-CM

## 2013-04-07 DIAGNOSIS — R601 Generalized edema: Secondary | ICD-10-CM

## 2013-04-07 DIAGNOSIS — K7030 Alcoholic cirrhosis of liver without ascites: Secondary | ICD-10-CM

## 2013-04-07 MED ORDER — HYDROXYZINE HCL 25 MG PO TABS: 25.0000 mg | ORAL_TABLET | Freq: Three times a day (TID) | ORAL | Status: AC | PRN

## 2013-04-07 NOTE — Patient Instructions (Signed)
1. Please try Hydroxyzine 25mg  up to three times daily for itching. 2. Please have your labs done. 3. Call if you have increased difficulty breathing or abdomen becomes tight.  4. We may be able to adjust your diuretics further once we see your labs. 5. You need to limit your salt intake to 2 grams daily. 6. You need to stop drinking alcohol!

## 2013-04-07 NOTE — Assessment & Plan Note (Signed)
57 y/o Hispanic male with history of ETOH cirrhosis, ETOH hepatitis, anasarca who presents for follow-up. His weight is up significantly since his last OV. He has moderate ascites, 3+ lower extremity edema. Complains of diffuse pruritis. Suspect worsening cholestasis related to his cirrhosis. Continues to consume etoh daily. Noncompliant with 2 gram sodium diet. Hospice now on board.   Had long discussion with wife and patient regarding poor prognosis. Wife has accepted this and wants him to enjoy time he has left. Patient told me he doesn't think anything bad is going to happen to him. Given ongoing etoh abuse, management of anasarca will be difficult.   Obtain updated labs to see if diuretics can be adjusted again. Urges compliance with our recommendations about 2 gram sodium diet and adherence to medical regimen. Requested etoh cessation but he is not interested. Trial of hydroxyzine for itching. Would likely respond better to questran but need to avoid constipation.  At this point, it is not clear the benefits of continuing for hepatoma surveillance. Will discuss further with Dr. Jena Gauss.  OV in six weeks with Dr. Jena Gauss.

## 2013-04-07 NOTE — Progress Notes (Signed)
Primary Care Physician: Estanislado Pandy, MD  Primary Gastroenterologist:  Roetta Sessions, MD   Chief Complaint  Patient presents with  . Follow-up    HPI: Austin Ruiz is a 57 y.o. male here for f/u of ETOH cirrhosis/ETOH hepatitis. History of ongoing etoh abuse despite inpatient etoh detox/rehab.   Since his last OV here on 01/05/13, he has been followed by Hospice. He has nurse coming to the home 1-2 times per week. According to his wife, they have to cancel some of the Hospice visits because of patient not being at home. He does quite a bit of walking. Denies SOB. Complains of itching, abdominal swelling and lower extremity of edema. He has had some itching in his genital area as well. Currently on Doxycycline for cellulitis of the legs. Prior to this was on Bactrim. BM 3 per day on lactulose. No melena, brbpr. Rare vomiting. No abdominal pain. Sometimes skips his diuretics. Does not adhere to his low sodium diet at all times. Consumes at least 80 ounces of beer daily.   Weight up from 152 to 179 since 12/2012.    Wife has accepted that he is dying from this liver disease/ongoing etoh abuse. Patient is in denial.  Current Outpatient Prescriptions  Medication Sig Dispense Refill  . doxycycline (DORYX) 100 MG EC tablet Take 100 mg by mouth 2 (two) times daily.      . furosemide (LASIX) 40 MG tablet Take by mouth. 40mg  every morning 20 mg in afternoon      . lactulose (CHRONULAC) 10 GM/15ML solution Take 30 mLs (20 g total) by mouth 3 (three) times daily.  1892 mL  0  . LORazepam (ATIVAN) 1 MG tablet Take 1 mg by mouth every 8 (eight) hours.      . Multiple Vitamin (MULTIVITAMIN WITH MINERALS) TABS Take 1 tablet by mouth daily.      . pantoprazole (PROTONIX) 40 MG tablet Take 40 mg by mouth every morning.      Marland Kitchen spironolactone (ALDACTONE) 100 MG tablet Take by mouth. 100 mg in am  50 mg in afternoon      . thiamine 100 MG tablet Take 1 tablet (100 mg total) by mouth daily.  30 tablet   0   No current facility-administered medications for this visit.    Allergies as of 04/07/2013  . (No Known Allergies)    ROS:  General: Negative for anorexia, weight loss, fever, chills, fatigue, weakness. ENT: Negative for hoarseness, difficulty swallowing , nasal congestion. CV: Negative for chest pain, angina, palpitations, dyspnea on exertion, peripheral edema.  Respiratory: Negative for dyspnea at rest, dyspnea on exertion, cough, sputum, wheezing.  GI: See history of present illness. GU:  Negative for dysuria, hematuria, urinary incontinence, urinary frequency, nocturnal urination.  Endo: see hpi   Physical Examination:   BP 142/71  Pulse 68  Temp(Src) 96.8 F (36 C) (Oral)  Ht 5\' 3"  (1.6 m)  Wt 179 lb 12.8 oz (81.557 kg)  BMI 31.86 kg/m2  General: Chronically ill-appearing Hispanic male in NAD. Accompanied by wife.  Eyes: Slight icterus. Mouth: Oropharyngeal mucosa moist and pink , no lesions erythema or exudate. Lungs: Clear to auscultation bilaterally.  Heart: Regular rate and rhythm, no murmurs rubs or gallops.  Abdomen: Bowel sounds are normal, moderate distention. Positive fluid wave. 2-3+pitting edema in the flanks bilaterally. Nontender.   Extremities: 3+pitting edema to knees bilaterally. Legs are shiny. Some diffuse erythema from mid-shin down on both legs. No clubbing or deformities. Genital exam:  some scaling of skin over testicles. Evidence of prior chronic edema. Neuro: Alert and oriented x 4   Skin: Warm and dry, no jaundice. See above.   Psych: Alert and cooperative, normal mood and affect.

## 2013-04-08 LAB — PROTIME-INR
INR: 2.51 — ABNORMAL HIGH (ref <1.50)
Prothrombin Time: 26.1 seconds — ABNORMAL HIGH (ref 11.6–15.2)

## 2013-04-08 LAB — CBC WITH DIFFERENTIAL/PLATELET
Basophils Absolute: 0.1 10*3/uL (ref 0.0–0.1)
Basophils Relative: 1 % (ref 0–1)
Eosinophils Absolute: 0.3 10*3/uL (ref 0.0–0.7)
Hemoglobin: 10 g/dL — ABNORMAL LOW (ref 13.0–17.0)
MCH: 36.2 pg — ABNORMAL HIGH (ref 26.0–34.0)
MCHC: 37 g/dL — ABNORMAL HIGH (ref 30.0–36.0)
Monocytes Relative: 25 % — ABNORMAL HIGH (ref 3–12)
Neutro Abs: 1.9 10*3/uL (ref 1.7–7.7)
Neutrophils Relative %: 42 % — ABNORMAL LOW (ref 43–77)
Platelets: 132 10*3/uL — ABNORMAL LOW (ref 150–400)
RDW: 14.5 % (ref 11.5–15.5)

## 2013-04-08 LAB — BASIC METABOLIC PANEL
BUN: 11 mg/dL (ref 6–23)
CO2: 26 mEq/L (ref 19–32)
Creat: 0.68 mg/dL (ref 0.50–1.35)
Potassium: 4.3 mEq/L (ref 3.5–5.3)
Sodium: 121 mEq/L — ABNORMAL LOW (ref 135–145)

## 2013-04-08 LAB — HEPATIC FUNCTION PANEL
AST: 69 U/L — ABNORMAL HIGH (ref 0–37)
Albumin: 2.1 g/dL — ABNORMAL LOW (ref 3.5–5.2)
Alkaline Phosphatase: 264 U/L — ABNORMAL HIGH (ref 39–117)
Total Bilirubin: 7.4 mg/dL — ABNORMAL HIGH (ref 0.3–1.2)

## 2013-04-08 NOTE — Progress Notes (Signed)
cc'd to pcp 

## 2013-04-13 NOTE — Progress Notes (Signed)
Quick Note:  DF >72. Meld ([place]) 21 Patient currently Hospice. Continues to drink therefore NO prednisolone planned. Overall labs essentially stable. Change diuretic dosing. Take lasix 60mg  in the morning along with spironolactone 150mg  in the morning. This will work better than splitting the dose. Call in one week with progress report on edema. ______

## 2013-04-27 ENCOUNTER — Telehealth: Payer: Self-pay

## 2013-04-27 MED ORDER — SPIRONOLACTONE 100 MG PO TABS: 150.0000 mg | ORAL_TABLET | Freq: Every day | ORAL | Status: AC

## 2013-04-27 MED ORDER — FUROSEMIDE 40 MG PO TABS: 60.0000 mg | ORAL_TABLET | Freq: Every day | ORAL | Status: AC

## 2013-04-27 NOTE — Telephone Encounter (Signed)
Done

## 2013-04-27 NOTE — Telephone Encounter (Signed)
Pt's swelling is better. He needs his Lasix and Spironolactone refilled since we changed the doses.

## 2013-05-13 ENCOUNTER — Other Ambulatory Visit: Payer: Self-pay

## 2013-05-13 MED ORDER — LACTULOSE 10 GM/15ML PO SOLN: 20.0000 g | Freq: Three times a day (TID) | ORAL | Status: AC

## 2013-05-18 ENCOUNTER — Encounter: Payer: Self-pay | Admitting: Internal Medicine

## 2013-05-18 ENCOUNTER — Ambulatory Visit (INDEPENDENT_AMBULATORY_CARE_PROVIDER_SITE_OTHER): Payer: BLUE CROSS/BLUE SHIELD | Admitting: Internal Medicine

## 2013-05-18 ENCOUNTER — Encounter (INDEPENDENT_AMBULATORY_CARE_PROVIDER_SITE_OTHER): Payer: Self-pay

## 2013-05-18 VITALS — BP 160/75 | HR 81 | Temp 98.2°F | Wt 175.8 lb

## 2013-05-18 DIAGNOSIS — K7030 Alcoholic cirrhosis of liver without ascites: Secondary | ICD-10-CM

## 2013-05-18 LAB — CBC WITH DIFFERENTIAL/PLATELET
Basophils Relative: 1 % (ref 0–1)
Eosinophils Relative: 7 % — ABNORMAL HIGH (ref 0–5)
Lymphocytes Relative: 23 % (ref 12–46)
Lymphs Abs: 1 10*3/uL (ref 0.7–4.0)
Monocytes Relative: 24 % — ABNORMAL HIGH (ref 3–12)
Neutrophils Relative %: 45 % (ref 43–77)
Platelets: 100 10*3/uL — ABNORMAL LOW (ref 150–400)
RBC: 3.08 MIL/uL — ABNORMAL LOW (ref 4.22–5.81)
WBC: 4.4 10*3/uL (ref 4.0–10.5)

## 2013-05-18 NOTE — Progress Notes (Signed)
Pt is aware of OV on 1/6 at 4 with RMR and appt card was given to the patient
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
## 2013-05-18 NOTE — Patient Instructions (Addendum)
Continue Aldactone and Lasix  Continue 2 gram sodium diet  Resume lactulose  CBC, MET- 7, LFT's  Office visit in 1 month  Will provide note stating medical condition with  frequent urination part of his illness

## 2013-05-19 LAB — HEPATIC FUNCTION PANEL
ALT: 19 U/L (ref 0–53)
Albumin: 2.2 g/dL — ABNORMAL LOW (ref 3.5–5.2)
Alkaline Phosphatase: 185 U/L — ABNORMAL HIGH (ref 39–117)
Indirect Bilirubin: 3.9 mg/dL — ABNORMAL HIGH (ref 0.0–0.9)
Total Bilirubin: 7.2 mg/dL — ABNORMAL HIGH (ref 0.3–1.2)

## 2013-05-19 LAB — BASIC METABOLIC PANEL
BUN: 6 mg/dL (ref 6–23)
CO2: 24 mEq/L (ref 19–32)
Chloride: 96 mEq/L (ref 96–112)
Creat: 0.6 mg/dL (ref 0.50–1.35)
Potassium: 4.4 mEq/L (ref 3.5–5.3)

## 2013-05-23 ENCOUNTER — Encounter: Payer: Self-pay | Admitting: Internal Medicine

## 2013-05-31 ENCOUNTER — Other Ambulatory Visit: Payer: Self-pay | Admitting: Gastroenterology

## 2013-05-31 ENCOUNTER — Encounter: Payer: Self-pay | Admitting: Gastroenterology

## 2013-05-31 ENCOUNTER — Telehealth: Payer: Self-pay | Admitting: Internal Medicine

## 2013-05-31 ENCOUNTER — Telehealth: Payer: Self-pay

## 2013-05-31 ENCOUNTER — Ambulatory Visit (INDEPENDENT_AMBULATORY_CARE_PROVIDER_SITE_OTHER): Payer: BLUE CROSS/BLUE SHIELD | Admitting: Gastroenterology

## 2013-05-31 VITALS — BP 159/74 | HR 75 | Temp 97.6°F | Wt 187.6 lb

## 2013-05-31 DIAGNOSIS — R188 Other ascites: Secondary | ICD-10-CM

## 2013-05-31 MED ORDER — PANTOPRAZOLE SODIUM 40 MG PO TBEC: 40.0000 mg | DELAYED_RELEASE_TABLET | Freq: Every day | ORAL | Status: AC

## 2013-05-31 NOTE — Telephone Encounter (Signed)
Pt coming in today

## 2013-05-31 NOTE — Telephone Encounter (Signed)
Pt's wife brought patient in for a weight check and wanted [facility]MR to know that his privates are swollen and his PCP advised him to have fluid drawn if necessary.

## 2013-05-31 NOTE — Telephone Encounter (Signed)
Tried to call pt- LMOM 

## 2013-05-31 NOTE — Telephone Encounter (Signed)
[name].
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
## 2013-05-31 NOTE — Telephone Encounter (Signed)
Routing to refill box  

## 2013-05-31 NOTE — Telephone Encounter (Signed)
Pt needs a refill of his protonix called into [facility] in [place].

## 2013-05-31 NOTE — Patient Instructions (Signed)
Please have blood work done.   I recommend a paracentesis, which will draw the fluid off your belly with a needle. On the morning of the procedure, do NOT take your lasix or aldactone.   DO NOT use any extra salt. You can only have a maximum of 2 grams of salt each day. Please let us know if you are wanting to go into rehab for alcohol.

## 2013-05-31 NOTE — Assessment & Plan Note (Signed)
57 year old male with ETOH cirrhosis, ETOH hepatitis, non-adherent to low sodium diet. Continues to drink ETOH daily. Refusing rehab despite known risks of early death. Returns with tense ascites and anasarca, scrotal edema. This is secondary to his non-compliance with dietary regimen. Will not adjust Lasix/aldactone combo right now, as it is futile in light of his persistent drinking.   Needs LVAP as soon as possible; hopefully, this can be done in the next 24-48 hours. Will send fluid analysis for cell count, cytology, and cultures. Needs Albumin 25 g IV X 1 if greater than 5 liters removed. Hold diuretics day of procedure. Further recommendations to follow.

## 2013-05-31 NOTE — Telephone Encounter (Signed)
Pt's weight today was 187.6  And on May 18, 2013 his weight was 175. Please advise if we need to change any of his medication.

## 2013-05-31 NOTE — Telephone Encounter (Signed)
Make sure he is taking his diuretics as prescribed. Needs to be on a 2 g sodium diet. If he is doing both of these things, I am not sure that increasing his diuretic regimen would be in his best interest and may be risky, particularly if he continues to drink alcohol. I would like for him to be seen by the extender sometime in the office between now and the end of next week.

## 2013-06-01 ENCOUNTER — Other Ambulatory Visit: Payer: Self-pay | Admitting: Gastroenterology

## 2013-06-01 ENCOUNTER — Encounter (HOSPITAL_COMMUNITY): Payer: Self-pay

## 2013-06-01 ENCOUNTER — Ambulatory Visit (HOSPITAL_COMMUNITY)
Admission: RE | Admit: 2013-06-01 | Discharge: 2013-06-01 | Disposition: A | Discharge status: Home / Self Care | Payer: Medicare Other | Source: Ambulatory Visit | Attending: Gastroenterology | Admitting: Gastroenterology

## 2013-06-01 ENCOUNTER — Telehealth: Payer: Self-pay

## 2013-06-01 DIAGNOSIS — R188 Other ascites: Secondary | ICD-10-CM

## 2013-06-01 LAB — COMPREHENSIVE METABOLIC PANEL
AST: 62 U/L — ABNORMAL HIGH (ref 0–37)
BUN: 7 mg/dL (ref 6–23)
CO2: 25 mEq/L (ref 19–32)
Calcium: 7.7 mg/dL — ABNORMAL LOW (ref 8.4–10.5)
Chloride: 93 mEq/L — ABNORMAL LOW (ref 96–112)
Creat: 0.66 mg/dL (ref 0.50–1.35)
Glucose, Bld: 91 mg/dL (ref 70–99)
Sodium: 124 mEq/L — ABNORMAL LOW (ref 135–145)

## 2013-06-01 NOTE — Progress Notes (Signed)
cc'd to pcp 

## 2013-06-01 NOTE — Telephone Encounter (Signed)
T/C from Guadalupe Guerra at [facility]:  ALERT    ALBUMIN 2.0 Forwarding to Gerrit Halls, NP who saw pt in the office yesterday.

## 2013-06-01 NOTE — Telephone Encounter (Signed)
Noted  

## 2013-06-02 ENCOUNTER — Telehealth: Payer: Self-pay | Admitting: *Deleted

## 2013-06-02 NOTE — Telephone Encounter (Signed)
Routing to AS 

## 2013-06-02 NOTE — Telephone Encounter (Signed)
Pt's wife called pt went to have fluid drawn off, radiology dept. Told pt there is not enough fluid to be drawn off. Please advise [phone]

## 2013-06-02 NOTE — Telephone Encounter (Signed)
He had subcutaneous edema. His sodium is lower, so we can't adjust his diuretics anymore. As long as he continues to drink, he will keep having these issues and worsen. I strongly advise an inpatient rehab facility; our options are limited while he is still actively drinking. He could possibly use compression stockings for his lower extremities, but this will only help a small amount. He ultimately needs to stop drinking; if he refuses to stop drinking and refuses rehab, we need to proceed with hospice again.

## 2013-06-03 ENCOUNTER — Other Ambulatory Visit: Payer: Self-pay

## 2013-06-03 ENCOUNTER — Other Ambulatory Visit: Payer: Self-pay | Admitting: Gastroenterology

## 2013-06-03 DIAGNOSIS — K7030 Alcoholic cirrhosis of liver without ascites: Secondary | ICD-10-CM

## 2013-06-03 NOTE — Telephone Encounter (Signed)
pts wife is aware. 

## 2013-06-12 LAB — BASIC METABOLIC PANEL
BUN: 12 mg/dL (ref 6–23)
CO2: 24 mEq/L (ref 19–32)
Calcium: 7.9 mg/dL — ABNORMAL LOW (ref 8.4–10.5)
Creat: 0.7 mg/dL (ref 0.50–1.35)
Glucose, Bld: 115 mg/dL — ABNORMAL HIGH (ref 70–99)

## 2013-06-25 NOTE — Progress Notes (Signed)
REVIEWED.  

## 2013-06-29 ENCOUNTER — Encounter: Payer: Self-pay | Admitting: Internal Medicine

## 2013-06-29 ENCOUNTER — Ambulatory Visit (INDEPENDENT_AMBULATORY_CARE_PROVIDER_SITE_OTHER): Payer: Medicare Other | Admitting: Internal Medicine

## 2013-06-29 ENCOUNTER — Ambulatory Visit: Payer: BLUE CROSS/BLUE SHIELD | Admitting: Internal Medicine

## 2013-06-29 VITALS — BP 128/72 | HR 64 | Temp 98.2°F | Wt 163.8 lb

## 2013-06-29 DIAGNOSIS — K7460 Unspecified cirrhosis of liver: Secondary | ICD-10-CM

## 2013-06-29 DIAGNOSIS — R609 Edema, unspecified: Secondary | ICD-10-CM

## 2013-06-29 DIAGNOSIS — R601 Generalized edema: Secondary | ICD-10-CM

## 2013-06-29 LAB — COMPREHENSIVE METABOLIC PANEL
ALT: 48 U/L (ref 0–53)
AST: 158 U/L — ABNORMAL HIGH (ref 0–37)
Albumin: 2 g/dL — ABNORMAL LOW (ref 3.5–5.2)
Alkaline Phosphatase: 165 U/L — ABNORMAL HIGH (ref 39–117)
BILIRUBIN TOTAL: 5.9 mg/dL — AB (ref 0.3–1.2)
BUN: 26 mg/dL — ABNORMAL HIGH (ref 6–23)
CHLORIDE: 91 meq/L — AB (ref 96–112)
CO2: 23 mEq/L (ref 19–32)
CREATININE: 1.11 mg/dL (ref 0.50–1.35)
Calcium: 7.6 mg/dL — ABNORMAL LOW (ref 8.4–10.5)
GLUCOSE: 109 mg/dL — AB (ref 70–99)
Potassium: 4.3 mEq/L (ref 3.5–5.3)
Sodium: 122 mEq/L — ABNORMAL LOW (ref 135–145)
TOTAL PROTEIN: 6.7 g/dL (ref 6.0–8.3)

## 2013-06-29 LAB — PROTIME-INR
INR: 2.52 — AB (ref <1.50)
Prothrombin Time: 26.5 seconds — ABNORMAL HIGH (ref 11.6–15.2)

## 2013-06-29 NOTE — Patient Instructions (Addendum)
Continue Aldactone and lasix at current dosing for now.  Get labs, CBC, chem-12, INR  Stop drinking alcohol  GET HEPATITIS A AND B VACCINATION AS PREVIOUSLY RECOMMENDED  Maintain a 2 gram sodium diet  Office visit with extender in 2 months
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
## 2013-06-30 LAB — CBC WITH DIFFERENTIAL/PLATELET
Basophils Absolute: 0 10*3/uL (ref 0.0–0.1)
Basophils Relative: 0 % (ref 0–1)
EOS ABS: 0.1 10*3/uL (ref 0.0–0.7)
Eosinophils Relative: 4 % (ref 0–5)
HEMATOCRIT: 26.9 % — AB (ref 39.0–52.0)
Hemoglobin: 9.8 g/dL — ABNORMAL LOW (ref 13.0–17.0)
LYMPHS ABS: 0.7 10*3/uL (ref 0.7–4.0)
LYMPHS PCT: 23 % (ref 12–46)
MCH: 35.1 pg — AB (ref 26.0–34.0)
MCHC: 36.4 g/dL — ABNORMAL HIGH (ref 30.0–36.0)
MCV: 96.4 fL (ref 78.0–100.0)
MONOS PCT: 20 % — AB (ref 3–12)
Monocytes Absolute: 0.6 10*3/uL (ref 0.1–1.0)
Neutro Abs: 1.5 10*3/uL — ABNORMAL LOW (ref 1.7–7.7)
Neutrophils Relative %: 53 % (ref 43–77)
Platelets: 97 10*3/uL — ABNORMAL LOW (ref 150–400)
RBC: 2.79 MIL/uL — ABNORMAL LOW (ref 4.22–5.81)
RDW: 15.3 % (ref 11.5–15.5)
WBC: 2.9 10*3/uL — ABNORMAL LOW (ref 4.0–10.5)

## 2013-07-14 ENCOUNTER — Other Ambulatory Visit: Payer: Self-pay | Admitting: Internal Medicine

## 2013-07-14 DIAGNOSIS — K7030 Alcoholic cirrhosis of liver without ascites: Secondary | ICD-10-CM

## 2013-07-15 ENCOUNTER — Other Ambulatory Visit: Payer: Self-pay

## 2013-07-15 DIAGNOSIS — K7030 Alcoholic cirrhosis of liver without ascites: Secondary | ICD-10-CM

## 2013-07-30 LAB — BASIC METABOLIC PANEL
BUN: 32 mg/dL — AB (ref 6–23)
CHLORIDE: 92 meq/L — AB (ref 96–112)
CO2: 22 mEq/L (ref 19–32)
CREATININE: 1.17 mg/dL (ref 0.50–1.35)
Calcium: 8 mg/dL — ABNORMAL LOW (ref 8.4–10.5)
Glucose, Bld: 80 mg/dL (ref 70–99)
POTASSIUM: 5.2 meq/L (ref 3.5–5.3)
Sodium: 122 mEq/L — ABNORMAL LOW (ref 135–145)

## 2013-08-01 ENCOUNTER — Encounter: Payer: Self-pay | Admitting: Internal Medicine

## 2013-08-01 NOTE — Progress Notes (Signed)
Patient ID: CARON TARDIF, male   DOB: 07/04/1955, 58 y.o.   MRN: [id number] Patient admitted to [facility] with a sodium of 118 elevated BUN and creatinine. Dr. Ronne Binning called me yesterday and today. It was felt that he was volume contracted. I recommended gentle saline challenge overnight. Serum BUN and creatinine have gone higher and he is 800 cc ahead on fluid.  Recommend trying some albumin x6 doses followed by Lasix 10 mg after each dosing. Unfortunately, patient continues to drink alcohol. He may be getting hepatorenal syndrome. Prognosis poor.  I recommended oral diuretic regimen be held.

## 2013-08-06 ENCOUNTER — Telehealth: Payer: Self-pay | Admitting: Internal Medicine

## 2013-08-06 NOTE — Telephone Encounter (Signed)
Spoke with pts daughterBennie Ruiz came [facility] on [facility]. He is now on hospice care. Pt has increased swelling, no appetite, and has SOB. Hospice has him on O2.   Pt family wants to know if he can have a paracentesis done?

## 2013-08-06 NOTE — Telephone Encounter (Signed)
Patients daughter is calling asking if Austin Ruiz is able to have a US Paracentesis due to fluid, please advise?

## 2013-08-06 NOTE — Telephone Encounter (Signed)
pts daughter is aware. Ok to schedule for next week. They will call hospice or the hospital if pt gets worse over the weekend.

## 2013-08-06 NOTE — Telephone Encounter (Signed)
We can arrange a paracentesis for the first of next week for comfort measures. Would still send it for cell count with differential and culture. Would limit paracentesis To 4 L

## 2013-08-09 ENCOUNTER — Other Ambulatory Visit: Payer: Self-pay | Admitting: Internal Medicine

## 2013-08-09 DIAGNOSIS — R188 Other ascites: Secondary | ICD-10-CM

## 2013-08-09 NOTE — Telephone Encounter (Signed)
Para is scheduled tentatively for Thursday Feb 19th at 11:00, but patient was advised to call tomorrow if weather permitting to check for any cancellations and if so patient will be moved up

## 2013-08-12 ENCOUNTER — Ambulatory Visit (HOSPITAL_COMMUNITY): Payer: BC Managed Care – PPO

## 2013-08-16 ENCOUNTER — Telehealth: Payer: Self-pay | Admitting: Internal Medicine

## 2013-08-16 NOTE — Telephone Encounter (Signed)
To whom it may concern:  [name] suffered from recurrent hepatitis which caused cirrhosis. He, unfortunately, passed away because of advanced liver disease related to hepatitis and cirrhosis.  Thanks for your attention.  Signed

## 2013-08-16 NOTE — Telephone Encounter (Signed)
Letter printed and given to Zeb Comfort

## 2013-08-16 NOTE — Telephone Encounter (Signed)
Pt's wife called today to let us know of patient's passing. His sister in law is trying to get a VISA to come over for a few weeks. She said that the lawyer working on getting her a VISA needs something from RMR saying what the medical diagnosis was for Austin Ruiz. The lawyer's email is [email or web address]

## 2013-08-17 ENCOUNTER — Ambulatory Visit: Payer: BLUE CROSS/BLUE SHIELD | Admitting: Gastroenterology

## 2013-08-22 DEATH — deceased

## 2013-08-30 ENCOUNTER — Ambulatory Visit: Payer: BLUE CROSS/BLUE SHIELD | Admitting: Gastroenterology

## 2015-09-19 ENCOUNTER — Encounter: Payer: Self-pay | Admitting: Internal Medicine
